# Patient Record
Sex: Female | Born: 1980 | Race: Black or African American | Hispanic: No | Marital: Single | Smoking: Current some day smoker
Health system: Southern US, Community
[De-identification: ages and names within clinical notes are randomized; demographics above are authoritative.]

## PROBLEM LIST (undated history)

## (undated) ENCOUNTER — Inpatient Hospital Stay (HOSPITAL_COMMUNITY): Payer: Self-pay

## (undated) DIAGNOSIS — N6459 Other signs and symptoms in breast: Secondary | ICD-10-CM

## (undated) DIAGNOSIS — D649 Anemia, unspecified: Secondary | ICD-10-CM

## (undated) DIAGNOSIS — N939 Abnormal uterine and vaginal bleeding, unspecified: Secondary | ICD-10-CM

## (undated) DIAGNOSIS — O0930 Supervision of pregnancy with insufficient antenatal care, unspecified trimester: Secondary | ICD-10-CM

## (undated) DIAGNOSIS — Z789 Other specified health status: Secondary | ICD-10-CM

## (undated) DIAGNOSIS — Z862 Personal history of diseases of the blood and blood-forming organs and certain disorders involving the immune mechanism: Secondary | ICD-10-CM

## (undated) DIAGNOSIS — L309 Dermatitis, unspecified: Secondary | ICD-10-CM

## (undated) DIAGNOSIS — Z973 Presence of spectacles and contact lenses: Secondary | ICD-10-CM

## (undated) DIAGNOSIS — Z3490 Encounter for supervision of normal pregnancy, unspecified, unspecified trimester: Secondary | ICD-10-CM

## (undated) DIAGNOSIS — Z87410 Personal history of cervical dysplasia: Secondary | ICD-10-CM

## (undated) DIAGNOSIS — Z87898 Personal history of other specified conditions: Secondary | ICD-10-CM

## (undated) HISTORY — DX: Dermatitis, unspecified: L30.9

## (undated) HISTORY — DX: Personal history of other specified conditions: Z87.898

## (undated) HISTORY — DX: Anemia, unspecified: D64.9

## (undated) HISTORY — PX: NO PAST SURGERIES: SHX2092

## (undated) SURGERY — Surgical Case
Anesthesia: *Unknown

---

## 2008-02-15 ENCOUNTER — Inpatient Hospital Stay (HOSPITAL_COMMUNITY): Admission: AD | Admit: 2008-02-15 | Discharge: 2008-02-15 | Payer: Self-pay | Admitting: Obstetrics & Gynecology

## 2008-02-15 ENCOUNTER — Ambulatory Visit: Payer: Self-pay | Admitting: Obstetrics and Gynecology

## 2008-03-01 ENCOUNTER — Inpatient Hospital Stay (HOSPITAL_COMMUNITY): Admission: RE | Admit: 2008-03-01 | Discharge: 2008-03-01 | Payer: Self-pay | Admitting: Obstetrics & Gynecology

## 2008-03-05 ENCOUNTER — Inpatient Hospital Stay (HOSPITAL_COMMUNITY): Admission: AD | Admit: 2008-03-05 | Discharge: 2008-03-05 | Payer: Self-pay | Admitting: Obstetrics & Gynecology

## 2008-03-05 ENCOUNTER — Ambulatory Visit: Payer: Self-pay | Admitting: Obstetrics & Gynecology

## 2008-03-28 ENCOUNTER — Emergency Department (HOSPITAL_COMMUNITY): Admission: EM | Admit: 2008-03-28 | Discharge: 2008-03-28 | Payer: Self-pay | Admitting: Family Medicine

## 2008-03-29 ENCOUNTER — Inpatient Hospital Stay (HOSPITAL_COMMUNITY): Admission: AD | Admit: 2008-03-29 | Discharge: 2008-03-29 | Payer: Self-pay | Admitting: Obstetrics & Gynecology

## 2009-12-28 IMAGING — US US OB LIMITED
1 series · 14 of 19 positions shown · non-contrast
Comparison: none

OBSTETRICAL ULTRASOUND:
 This ultrasound exam was performed in the [HOSPITAL] Ultrasound Department.  The OB US report was generated in the AS system, and faxed to the ordering physician.  This report is also available in [REDACTED] PACS.

[Series 1: us ob limited · 0.28mm/px · 14 of 19 slices shown]
[im 1/19]
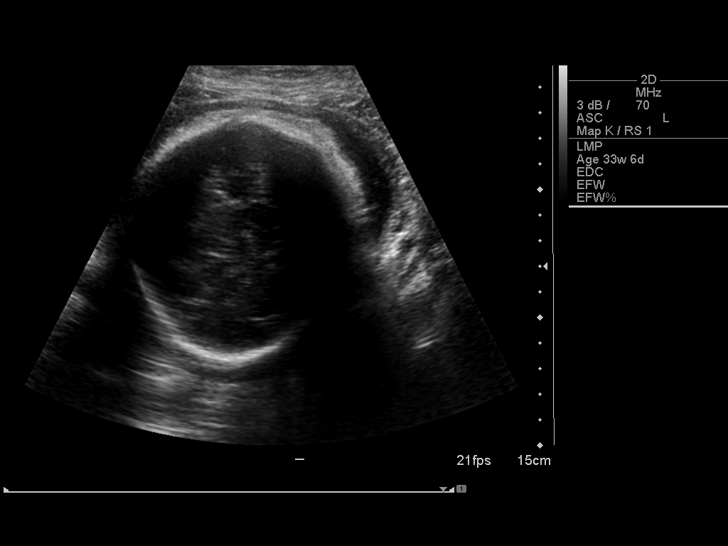
[im 3/19]
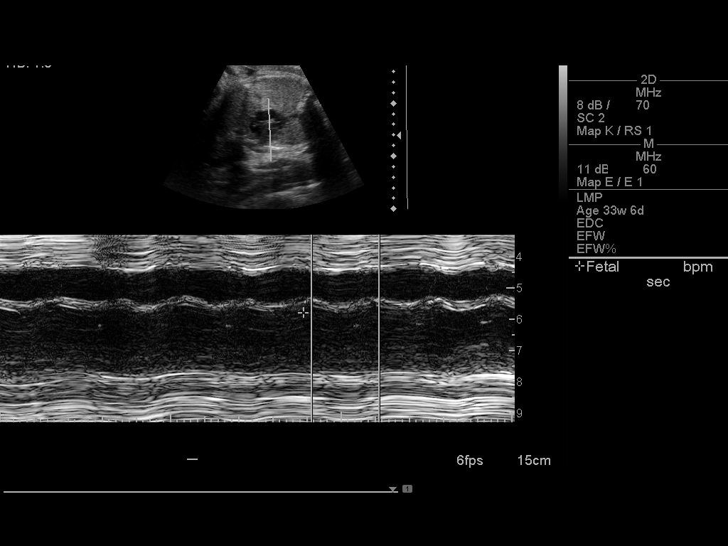
[im 4/19]
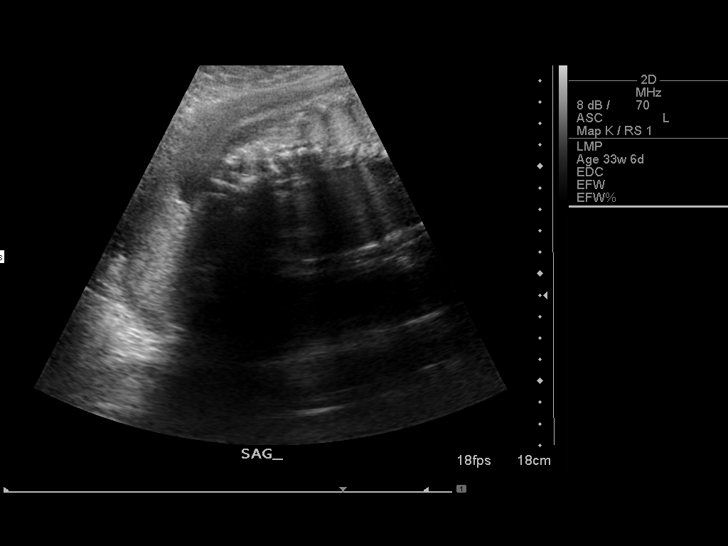
[im 5/19]
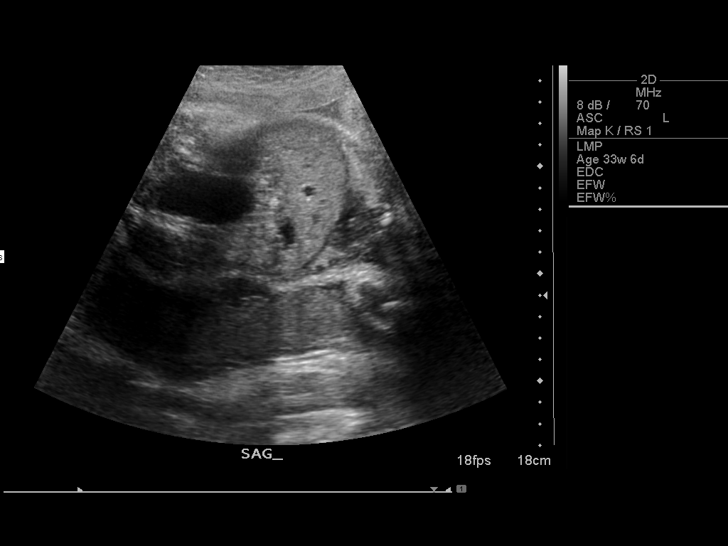
[im 7/19]
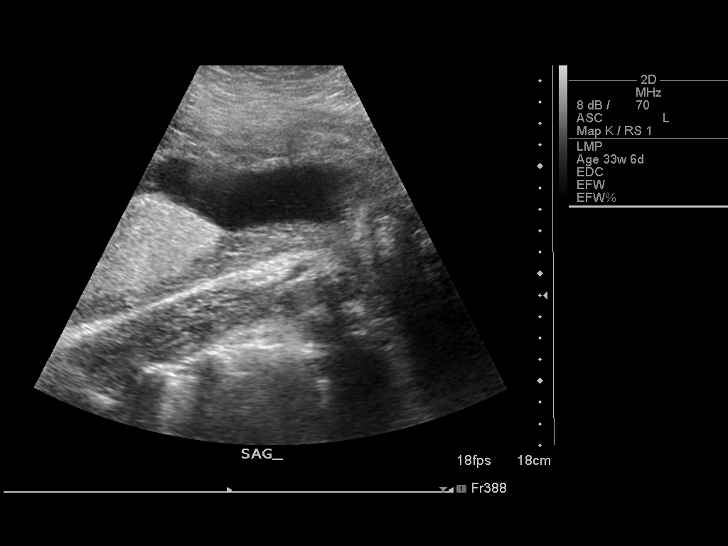
[im 8/19]
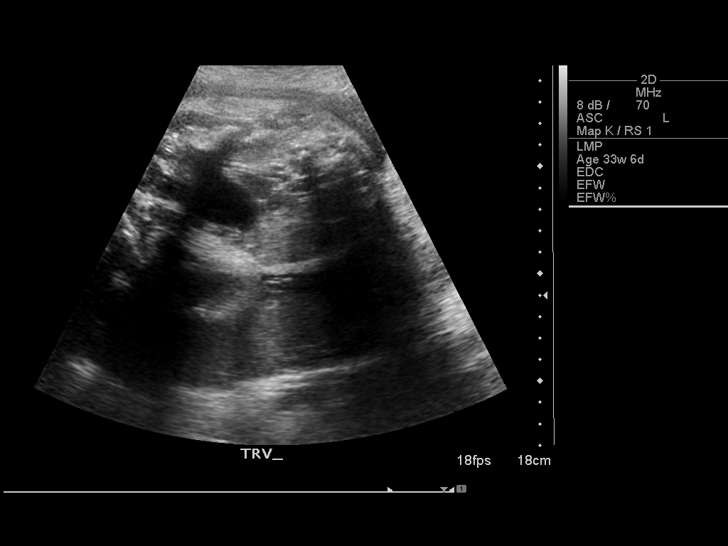
[im 9/19]
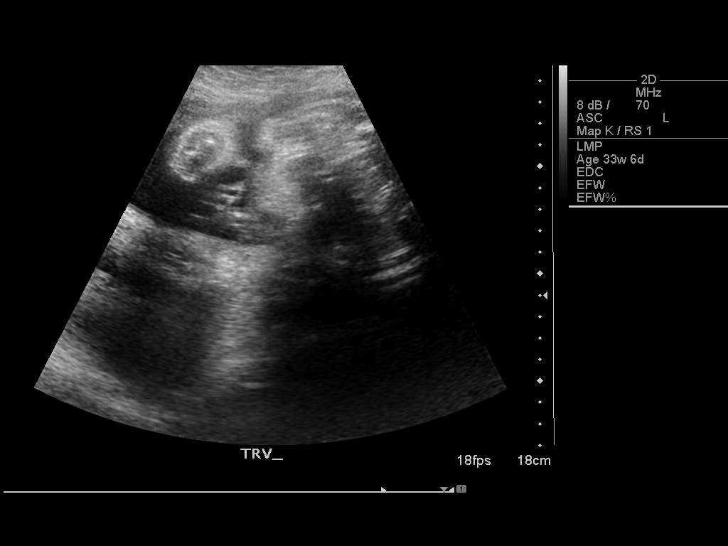
[im 11/19]
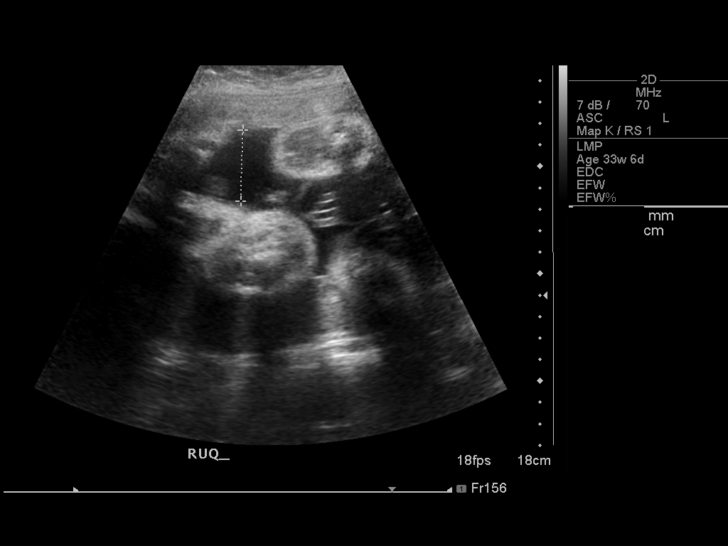
[im 12/19]
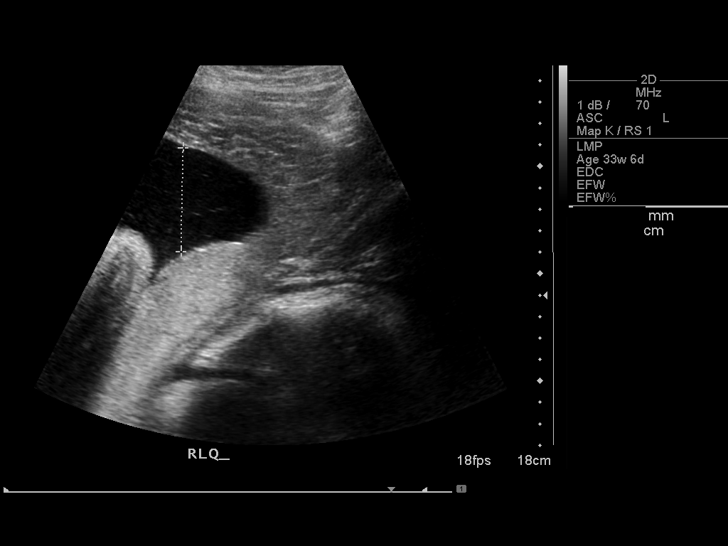
[im 13/19]
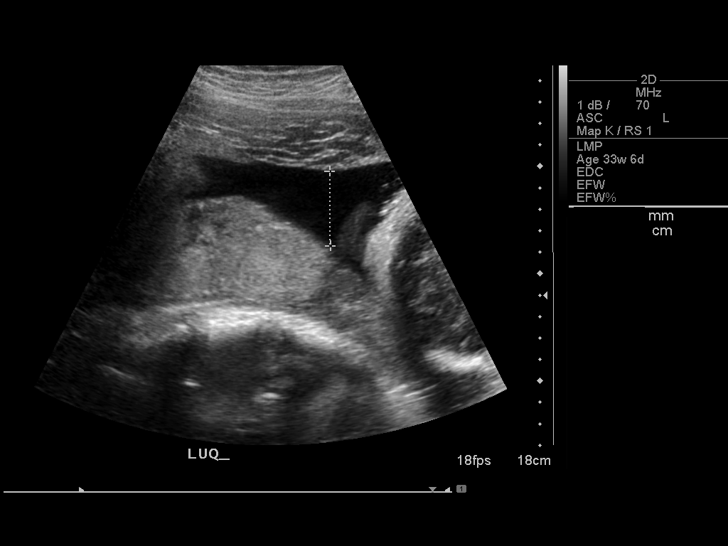
[im 15/19]
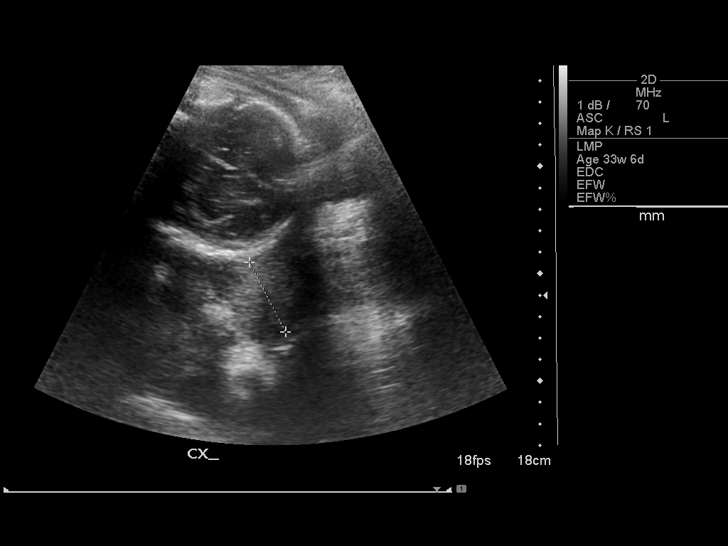
[im 16/19]
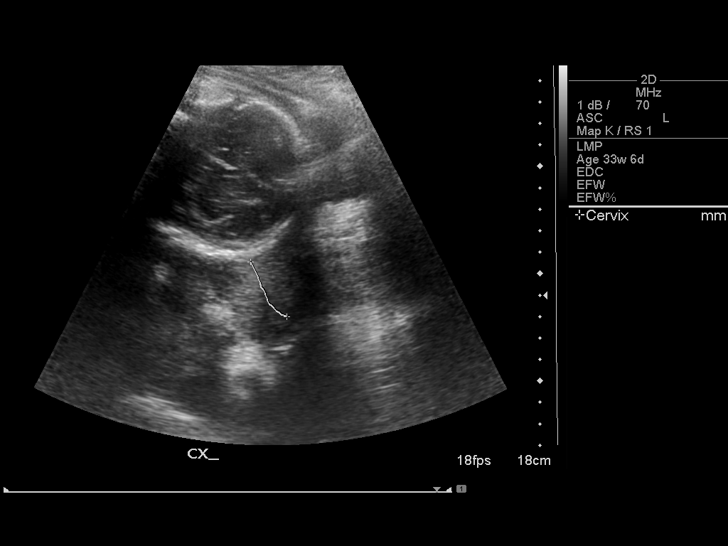
[im 17/19]
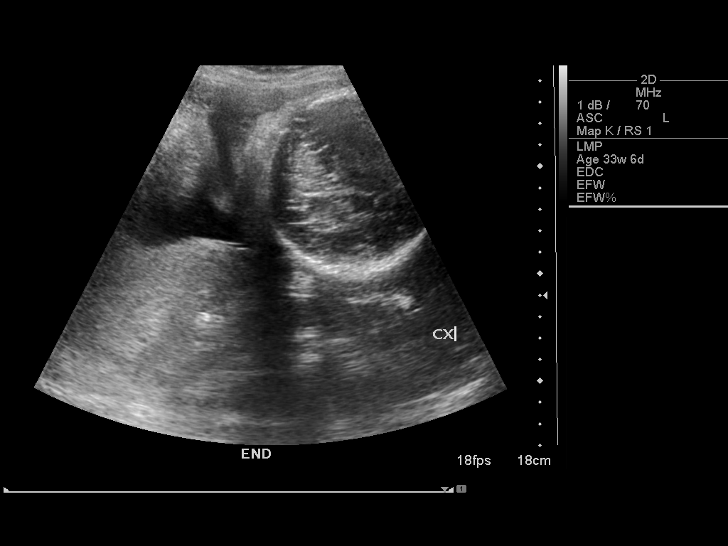
[im 19/19]
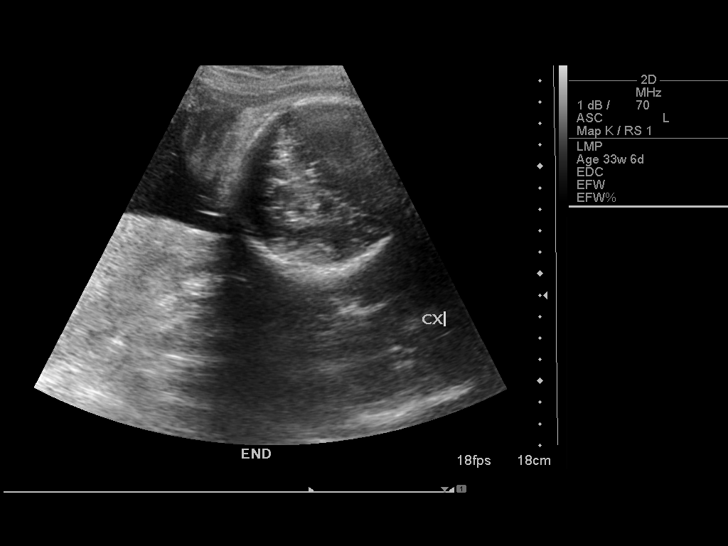

[14 of 19 positions shown; findings below may reference images not displayed]

IMPRESSION: See AS Obstetric US report.

## 2010-12-22 ENCOUNTER — Emergency Department (HOSPITAL_COMMUNITY)
Admission: EM | Admit: 2010-12-22 | Discharge: 2010-12-23 | Discharge status: Against Medical Advice | Payer: Self-pay | Attending: Emergency Medicine | Admitting: Emergency Medicine

## 2010-12-22 DIAGNOSIS — R220 Localized swelling, mass and lump, head: Secondary | ICD-10-CM | POA: Insufficient documentation

## 2010-12-22 DIAGNOSIS — K089 Disorder of teeth and supporting structures, unspecified: Secondary | ICD-10-CM | POA: Insufficient documentation

## 2011-02-21 ENCOUNTER — Emergency Department (HOSPITAL_COMMUNITY)
Admission: EM | Admit: 2011-02-21 | Discharge: 2011-02-21 | Disposition: A | Discharge status: Home / Self Care | Payer: Medicaid Other | Attending: Emergency Medicine | Admitting: Emergency Medicine

## 2011-02-21 ENCOUNTER — Emergency Department (HOSPITAL_COMMUNITY): Payer: Medicaid Other

## 2011-02-21 DIAGNOSIS — R0602 Shortness of breath: Secondary | ICD-10-CM | POA: Insufficient documentation

## 2011-02-21 DIAGNOSIS — R062 Wheezing: Secondary | ICD-10-CM | POA: Insufficient documentation

## 2011-02-21 DIAGNOSIS — R0609 Other forms of dyspnea: Secondary | ICD-10-CM | POA: Insufficient documentation

## 2011-02-21 DIAGNOSIS — R0989 Other specified symptoms and signs involving the circulatory and respiratory systems: Secondary | ICD-10-CM | POA: Insufficient documentation

## 2011-06-17 LAB — URINALYSIS, ROUTINE W REFLEX MICROSCOPIC
Glucose, UA: NEGATIVE
Hgb urine dipstick: NEGATIVE
Specific Gravity, Urine: 1.02

## 2011-06-17 LAB — URINE MICROSCOPIC-ADD ON

## 2012-05-20 LAB — OB RESULTS CONSOLE ANTIBODY SCREEN: Antibody Screen: NEGATIVE

## 2012-05-20 LAB — OB RESULTS CONSOLE GC/CHLAMYDIA
Chlamydia: NEGATIVE
Gonorrhea: NEGATIVE

## 2012-05-20 LAB — OB RESULTS CONSOLE RUBELLA ANTIBODY, IGM: Rubella: IMMUNE

## 2012-05-20 LAB — OB RESULTS CONSOLE HIV ANTIBODY (ROUTINE TESTING): HIV: NONREACTIVE

## 2012-05-20 LAB — OB RESULTS CONSOLE RPR: RPR: NONREACTIVE

## 2012-09-21 NOTE — L&D Delivery Note (Signed)
Delivery Note At 4:03 PM a viable female was delivered via  (Presentation: OA ).  APGAR: , ; weight P .   Placenta status: delivered, intact.  Cord:  with the following complications: nuchal cord x 2 .    Anesthesia: Epidural  Episiotomy: none Lacerations: none Suture Repair: N/A Est. Blood Loss (mL): 400cc  Mom to postpartum.  Baby to stay with mommy.  [name],Lisa Faux 09/27/2012, 4:14 PM  O+/ Bo/ BTL after 30day paper Desires circ for female infant, d/w pt r/b/a - to schedule

## 2012-09-23 ENCOUNTER — Inpatient Hospital Stay (HOSPITAL_COMMUNITY)
Admission: AD | Admit: 2012-09-23 | Discharge: 2012-09-23 | Disposition: A | Discharge status: Home / Self Care | Payer: Medicaid Other | Source: Ambulatory Visit | Attending: Obstetrics and Gynecology | Admitting: Obstetrics and Gynecology

## 2012-09-23 ENCOUNTER — Encounter (HOSPITAL_COMMUNITY): Payer: Self-pay | Admitting: *Deleted

## 2012-09-23 ENCOUNTER — Telehealth (HOSPITAL_COMMUNITY): Payer: Self-pay | Admitting: *Deleted

## 2012-09-23 DIAGNOSIS — O479 False labor, unspecified: Secondary | ICD-10-CM | POA: Insufficient documentation

## 2012-09-23 HISTORY — DX: Other specified health status: Z78.9

## 2012-09-23 NOTE — Telephone Encounter (Signed)
Preadmission screen  

## 2012-09-23 NOTE — MAU Note (Signed)
Pt G5 P4 at 39.1wks having contractions, denies leaking or bleeding.

## 2012-09-26 ENCOUNTER — Encounter (HOSPITAL_COMMUNITY): Payer: Self-pay

## 2012-09-26 DIAGNOSIS — Z3490 Encounter for supervision of normal pregnancy, unspecified, unspecified trimester: Secondary | ICD-10-CM

## 2012-09-26 DIAGNOSIS — O0930 Supervision of pregnancy with insufficient antenatal care, unspecified trimester: Secondary | ICD-10-CM | POA: Insufficient documentation

## 2012-09-26 HISTORY — DX: Encounter for supervision of normal pregnancy, unspecified, unspecified trimester: Z34.90

## 2012-09-26 HISTORY — DX: Supervision of pregnancy with insufficient antenatal care, unspecified trimester: O09.30

## 2012-09-26 MED ORDER — PRENATAL 27-0.8 MG PO TABS: 1.0000 | ORAL_TABLET | Freq: Every day | ORAL | Status: DC

## 2012-09-26 NOTE — H&P (Signed)
Lisa [name] is a 32 y.o. female G5P4004 at 39+ for iol given term status and favorable cervix. Pt's pregnancy complicated by late prenatal care also somewhat limited.  GBBS + in urine also.  Desires BTL, but 30 day papers signed in mid-December, so will have to be interval. D/W pt r/b/a of IOL, wishes to proceed. Maternal Medical History:  Contractions: Frequency: irregular.    Fetal activity: Perceived fetal activity is normal.    Prenatal complications: Substance abuse.     OB History    Grav Para Term Preterm Abortions TAB SAB Ect Mult Living   5 4 4       4     G1 FAVD, G2-G4 SVD, G5 present.  No Abn pap, no STDs  Past Medical History  Diagnosis Date  . No pertinent past medical history   . Anemia   . Eczema   . Hx of mastitis   . Normal pregnancy 09/26/2012  . Late prenatal care complicating pregnancy 09/26/2012   Past Surgical History  Procedure Date  . No past surgeries    Family History: family history includes COPD in her maternal aunt and maternal uncle; Cancer in her father, maternal aunt, mother, and paternal grandmother; Diabetes in her father and mother; Hypertension in her maternal grandmother and mother; and Thyroid cancer in her mother. Social History:  reports that she has been smoking Cigarettes.  She has been smoking about .25 packs per day. She has never used smokeless tobacco. She reports that she does not drink alcohol or use illicit drugs.single Meds PNV All NKDA   Prenatal Transfer Tool  Maternal Diabetes: No Genetic Screening: Normal Maternal Ultrasounds/Referrals: Normal Fetal Ultrasounds or other Referrals:  None Maternal Substance Abuse:  Yes:  Type: Smoker Significant Maternal Medications:  None Significant Maternal Lab Results:  Lab values include: Group B Strep positive Other Comments:  late PNC, somewhat limited  Review of Systems  Constitutional: Negative.   HENT: Negative.   Eyes: Negative.   Respiratory: Negative.   Cardiovascular:  Negative.   Gastrointestinal: Negative.   Genitourinary: Negative.   Musculoskeletal: Negative.   Skin: Negative.   Neurological: Negative.   Psychiatric/Behavioral: Negative.       There were no vitals taken for this visit. Maternal Exam:  Abdomen: Fundal height is appropriate for gestation.   Estimated fetal weight is 6-7#.   Fetal presentation: vertex  Introitus: Normal vulva. Normal vagina.  Pelvis: adequate for delivery.   Cervix: Cervix evaluated by digital exam.     Physical Exam  Constitutional: She is oriented to person, place, and time. She appears well-developed and well-nourished.  Cardiovascular: Normal rate and regular rhythm.   Respiratory: Effort normal and breath sounds normal. No respiratory distress.  GI: Soft. Bowel sounds are normal. There is no tenderness.  Musculoskeletal: Normal range of motion.  Neurological: She is alert and oriented to person, place, and time.  Psychiatric: She has a normal mood and affect. Her behavior is normal.    Prenatal labs: ABO, Rh: O/Positive/-- (08/30 0000) Antibody: Negative (08/30 0000) Rubella: Immune (08/30 0000) RPR: Nonreactive (08/30 0000)  HBsAg: Negative (08/30 0000)  HIV: Non-reactive (08/30 0000)  GBS: Positive (08/30 0000)  Hgg 12.0/ Pap WNL/ Plt 336K/Hgb electro WNL/ GC neg/ Chl neg/CF neg/ AFP WNL/ glucola 74  [place] cwd EDC 09/29/12, nl anat, post plac, female  Tdap/Flu 07/02/11 Assessment/Plan: 31yo [id number] at 39+ for IOL given term and favorable Expect SVD Epidural prn, IV pain meds prn Pitocin AROM after PCN  PCN for gbbs+   BOVARD,Kennetha Pearman 09/26/2012, 8:25 PM

## 2012-09-27 ENCOUNTER — Encounter (HOSPITAL_COMMUNITY): Payer: Self-pay | Admitting: Anesthesiology

## 2012-09-27 ENCOUNTER — Encounter (HOSPITAL_COMMUNITY): Payer: Self-pay

## 2012-09-27 ENCOUNTER — Inpatient Hospital Stay (HOSPITAL_COMMUNITY)
Admission: RE | Admit: 2012-09-27 | Discharge: 2012-09-29 | DRG: 775 | Disposition: A | Discharge status: Home / Self Care | Payer: Medicaid Other | Source: Ambulatory Visit | Attending: Obstetrics and Gynecology | Admitting: Obstetrics and Gynecology

## 2012-09-27 ENCOUNTER — Inpatient Hospital Stay (HOSPITAL_COMMUNITY): Payer: Medicaid Other | Admitting: Anesthesiology

## 2012-09-27 VITALS — BP 125/74 | HR 76 | Temp 98.4°F | Resp 18 | Ht 61.0 in | Wt 215.0 lb

## 2012-09-27 DIAGNOSIS — O0930 Supervision of pregnancy with insufficient antenatal care, unspecified trimester: Secondary | ICD-10-CM

## 2012-09-27 DIAGNOSIS — Z3490 Encounter for supervision of normal pregnancy, unspecified, unspecified trimester: Secondary | ICD-10-CM

## 2012-09-27 DIAGNOSIS — Z22330 Carrier of Group B streptococcus: Secondary | ICD-10-CM

## 2012-09-27 DIAGNOSIS — O99892 Other specified diseases and conditions complicating childbirth: Secondary | ICD-10-CM | POA: Diagnosis present

## 2012-09-27 HISTORY — DX: Encounter for supervision of normal pregnancy, unspecified, unspecified trimester: Z34.90

## 2012-09-27 HISTORY — DX: Supervision of pregnancy with insufficient antenatal care, unspecified trimester: O09.30

## 2012-09-27 LAB — CBC
HCT: 34.6 % — ABNORMAL LOW (ref 36.0–46.0)
MCV: 91.5 fL (ref 78.0–100.0)
RDW: 13.8 % (ref 11.5–15.5)
WBC: 11.2 10*3/uL — ABNORMAL HIGH (ref 4.0–10.5)

## 2012-09-27 LAB — TYPE AND SCREEN
ABO/RH(D): O POS
Antibody Screen: NEGATIVE

## 2012-09-27 LAB — ABO/RH: ABO/RH(D): O POS

## 2012-09-27 MED ORDER — SENNOSIDES-DOCUSATE SODIUM 8.6-50 MG PO TABS
2.0000 | ORAL_TABLET | Freq: Every day | ORAL | Status: DC
  Administered 2012-09-27 – 2012-09-28 (×2): 2 via ORAL

## 2012-09-27 MED ORDER — IBUPROFEN 600 MG PO TABS
600.0000 mg | ORAL_TABLET | Freq: Four times a day (QID) | ORAL | Status: DC
  Administered 2012-09-28 – 2012-09-29 (×7): 600 mg via ORAL
  Filled 2012-09-27 (×7): qty 1

## 2012-09-27 MED ORDER — ONDANSETRON HCL 4 MG/2ML IJ SOLN
4.0000 mg | Freq: Four times a day (QID) | INTRAMUSCULAR | Status: DC | PRN
  Administered 2012-09-27: 4 mg via INTRAVENOUS
  Filled 2012-09-27: qty 2

## 2012-09-27 MED ORDER — ACETAMINOPHEN 325 MG PO TABS: 650.0000 mg | ORAL_TABLET | ORAL | Status: DC | PRN

## 2012-09-27 MED ORDER — ONDANSETRON HCL 4 MG/2ML IJ SOLN: 4.0000 mg | INTRAMUSCULAR | Status: DC | PRN

## 2012-09-27 MED ORDER — PENICILLIN G POTASSIUM 5000000 UNITS IJ SOLR
5.0000 10*6.[IU] | Freq: Once | INTRAVENOUS | Status: AC
  Administered 2012-09-27: 5 10*6.[IU] via INTRAVENOUS
  Filled 2012-09-27: qty 5

## 2012-09-27 MED ORDER — PHENYLEPHRINE 40 MCG/ML (10ML) SYRINGE FOR IV PUSH (FOR BLOOD PRESSURE SUPPORT)
80.0000 ug | PREFILLED_SYRINGE | INTRAVENOUS | Status: DC | PRN
  Administered 2012-09-27: 80 ug via INTRAVENOUS

## 2012-09-27 MED ORDER — LACTATED RINGERS IV SOLN: 500.0000 mL | Freq: Once | INTRAVENOUS | Status: DC

## 2012-09-27 MED ORDER — DIPHENHYDRAMINE HCL 25 MG PO CAPS: 25.0000 mg | ORAL_CAPSULE | Freq: Four times a day (QID) | ORAL | Status: DC | PRN

## 2012-09-27 MED ORDER — LIDOCAINE HCL (PF) 1 % IJ SOLN
INTRAMUSCULAR | Status: DC | PRN
  Administered 2012-09-27 (×2): 4 mL

## 2012-09-27 MED ORDER — WITCH HAZEL-GLYCERIN EX PADS: 1.0000 "application " | MEDICATED_PAD | CUTANEOUS | Status: DC | PRN

## 2012-09-27 MED ORDER — TETANUS-DIPHTH-ACELL PERTUSSIS 5-2.5-18.5 LF-MCG/0.5 IM SUSP: 0.5000 mL | Freq: Once | INTRAMUSCULAR | Status: DC

## 2012-09-27 MED ORDER — LACTATED RINGERS IV SOLN: INTRAVENOUS | Status: DC

## 2012-09-27 MED ORDER — SIMETHICONE 80 MG PO CHEW: 80.0000 mg | CHEWABLE_TABLET | ORAL | Status: DC | PRN

## 2012-09-27 MED ORDER — DIPHENHYDRAMINE HCL 50 MG/ML IJ SOLN: 12.5000 mg | INTRAMUSCULAR | Status: DC | PRN

## 2012-09-27 MED ORDER — ONDANSETRON HCL 4 MG PO TABS: 4.0000 mg | ORAL_TABLET | ORAL | Status: DC | PRN

## 2012-09-27 MED ORDER — PRENATAL MULTIVITAMIN CH
1.0000 | ORAL_TABLET | Freq: Every day | ORAL | Status: DC
  Administered 2012-09-27 – 2012-09-29 (×3): 1 via ORAL
  Filled 2012-09-27 (×3): qty 1

## 2012-09-27 MED ORDER — OXYTOCIN 40 UNITS IN LACTATED RINGERS INFUSION - SIMPLE MED: 62.5000 mL/h | INTRAVENOUS | Status: DC

## 2012-09-27 MED ORDER — TERBUTALINE SULFATE 1 MG/ML IJ SOLN: 0.2500 mg | Freq: Once | INTRAMUSCULAR | Status: DC | PRN

## 2012-09-27 MED ORDER — LACTATED RINGERS IV SOLN
INTRAVENOUS | Status: DC
  Administered 2012-09-27: 900 mL via INTRAVENOUS
  Administered 2012-09-27: 1000 mL via INTRAVENOUS

## 2012-09-27 MED ORDER — OXYTOCIN 40 UNITS IN LACTATED RINGERS INFUSION - SIMPLE MED
1.0000 m[IU]/min | INTRAVENOUS | Status: DC
  Administered 2012-09-27: 2 m[IU]/min via INTRAVENOUS
  Administered 2012-09-27: 10 m[IU]/min via INTRAVENOUS
  Filled 2012-09-27: qty 1000

## 2012-09-27 MED ORDER — OXYTOCIN BOLUS FROM INFUSION: 500.0000 mL | INTRAVENOUS | Status: DC

## 2012-09-27 MED ORDER — FENTANYL 2.5 MCG/ML BUPIVACAINE 1/10 % EPIDURAL INFUSION (WH - ANES)
INTRAMUSCULAR | Status: DC | PRN
  Administered 2012-09-27: 12 mL/h via EPIDURAL

## 2012-09-27 MED ORDER — OXYCODONE-ACETAMINOPHEN 5-325 MG PO TABS: 1.0000 | ORAL_TABLET | ORAL | Status: DC | PRN

## 2012-09-27 MED ORDER — PHENYLEPHRINE 40 MCG/ML (10ML) SYRINGE FOR IV PUSH (FOR BLOOD PRESSURE SUPPORT)
80.0000 ug | PREFILLED_SYRINGE | INTRAVENOUS | Status: DC | PRN
  Filled 2012-09-27: qty 5

## 2012-09-27 MED ORDER — PENICILLIN G POTASSIUM 5000000 UNITS IJ SOLR
2.5000 10*6.[IU] | INTRAVENOUS | Status: DC
  Administered 2012-09-27 (×2): 2.5 10*6.[IU] via INTRAVENOUS
  Filled 2012-09-27 (×5): qty 2.5

## 2012-09-27 MED ORDER — BUTORPHANOL TARTRATE 1 MG/ML IJ SOLN: 2.0000 mg | INTRAMUSCULAR | Status: DC | PRN

## 2012-09-27 MED ORDER — IBUPROFEN 600 MG PO TABS: 600.0000 mg | ORAL_TABLET | Freq: Four times a day (QID) | ORAL | Status: DC | PRN

## 2012-09-27 MED ORDER — EPHEDRINE 5 MG/ML INJ: 10.0000 mg | INTRAVENOUS | Status: DC | PRN

## 2012-09-27 MED ORDER — EPHEDRINE 5 MG/ML INJ
10.0000 mg | INTRAVENOUS | Status: DC | PRN
  Filled 2012-09-27: qty 4

## 2012-09-27 MED ORDER — BENZOCAINE-MENTHOL 20-0.5 % EX AERO: 1.0000 "application " | INHALATION_SPRAY | CUTANEOUS | Status: DC | PRN

## 2012-09-27 MED ORDER — LACTATED RINGERS IV SOLN
500.0000 mL | INTRAVENOUS | Status: DC | PRN
  Administered 2012-09-27: 1000 mL via INTRAVENOUS

## 2012-09-27 MED ORDER — ZOLPIDEM TARTRATE 5 MG PO TABS: 5.0000 mg | ORAL_TABLET | Freq: Every evening | ORAL | Status: DC | PRN

## 2012-09-27 MED ORDER — CITRIC ACID-SODIUM CITRATE 334-500 MG/5ML PO SOLN: 30.0000 mL | ORAL | Status: DC | PRN

## 2012-09-27 MED ORDER — FENTANYL 2.5 MCG/ML BUPIVACAINE 1/10 % EPIDURAL INFUSION (WH - ANES)
14.0000 mL/h | INTRAMUSCULAR | Status: DC
  Filled 2012-09-27: qty 125

## 2012-09-27 MED ORDER — LIDOCAINE HCL (PF) 1 % IJ SOLN: 30.0000 mL | INTRAMUSCULAR | Status: DC | PRN

## 2012-09-27 MED ORDER — DIBUCAINE 1 % RE OINT: 1.0000 "application " | TOPICAL_OINTMENT | RECTAL | Status: DC | PRN

## 2012-09-27 MED ORDER — OXYCODONE-ACETAMINOPHEN 5-325 MG PO TABS
1.0000 | ORAL_TABLET | ORAL | Status: DC | PRN
  Administered 2012-09-28 (×2): 1 via ORAL
  Filled 2012-09-27 (×2): qty 1

## 2012-09-27 MED ORDER — LANOLIN HYDROUS EX OINT: TOPICAL_OINTMENT | CUTANEOUS | Status: DC | PRN

## 2012-09-27 NOTE — Anesthesia Procedure Notes (Signed)
Epidural Patient location during procedure: OB Start time: 09/27/2012 10:32 AM  Staffing Anesthesiologist: Edgard Debord A. Performed by: anesthesiologist   Preanesthetic Checklist Completed: patient identified, site marked, surgical consent, pre-op evaluation, timeout performed, IV checked, risks and benefits discussed and monitors and equipment checked  Epidural Patient position: sitting Prep: site prepped and draped and DuraPrep Patient monitoring: continuous pulse ox and blood pressure Approach: midline Injection technique: LOR air  Needle:  Needle type: Tuohy  Needle gauge: 17 G Needle length: 9 cm and 9 Needle insertion depth: 5 cm cm Catheter type: closed end flexible Catheter size: 19 Gauge Catheter at skin depth: 10 cm Test dose: negative and Other  Assessment Events: blood not aspirated, injection not painful, no injection resistance, negative IV test and no paresthesia  Additional Notes Patient identified. Risks and benefits discussed including failed block, incomplete  Pain control, post dural puncture headache, nerve damage, paralysis, blood pressure Changes, nausea, vomiting, reactions to medications-both toxic and allergic and post Partum back pain. All questions were answered. Patient expressed understanding and wished to proceed. Sterile technique was used throughout procedure. Epidural site was Dressed with sterile barrier dressing. No paresthesias, signs of intravascular injection Or signs of intrathecal spread were encountered.  Patient was more comfortable after the epidural was dosed. Please see RN's note for documentation of vital signs and FHR which are stable.

## 2012-09-27 NOTE — Anesthesia Preprocedure Evaluation (Signed)
Anesthesia Evaluation  Patient identified by MRN, date of birth, ID band Patient awake    Reviewed: Allergy & Precautions, H&P , Patient's Chart, lab work & pertinent test results  Airway Mallampati: III TM Distance: >3 FB Neck ROM: full    Dental No notable dental hx. (+) Teeth Intact   Pulmonary neg pulmonary ROS, Current Smoker,  breath sounds clear to auscultation  Pulmonary exam normal       Cardiovascular negative cardio ROS  Rhythm:regular Rate:Normal     Neuro/Psych negative neurological ROS  negative psych ROS   GI/Hepatic negative GI ROS, Neg liver ROS,   Endo/Other  negative endocrine ROS  Renal/GU negative Renal ROS  negative genitourinary   Musculoskeletal   Abdominal Normal abdominal exam  (+)   Peds  Hematology  (+) anemia ,   Anesthesia Other Findings   Reproductive/Obstetrics (+) Pregnancy                           Anesthesia Physical Anesthesia Plan  ASA: II  Anesthesia Plan: Epidural   Post-op Pain Management:    Induction:   Airway Management Planned:   Additional Equipment:   Intra-op Plan:   Post-operative Plan:   Informed Consent: I have reviewed the patients History and Physical, chart, labs and discussed the procedure including the risks, benefits and alternatives for the proposed anesthesia with the patient or authorized representative who has indicated his/her understanding and acceptance.     Plan Discussed with: Anesthesiologist  Anesthesia Plan Comments:         Anesthesia Quick Evaluation

## 2012-09-27 NOTE — Progress Notes (Signed)
Patient ID: Lisa [name], female   DOB: 1981/04/15, 32 y.o.   MRN: [id number]  Comfortable with epidural  AFVSS  gen NAD FHTs 140's mod var toco q 4 min  AROM for clear fluid, w/o diff/comp  32 yo G5P4004 for iol contimnue iol, expect SVD

## 2012-09-27 NOTE — Progress Notes (Signed)
Patient ID: Lisa [name], female   DOB: 08/29/1981, 32 y.o.   MRN: [id number]  Reviewed POC SVE 4/50/-2 130, category 1 toco irr  pitocin for IOL, AROM 4 hr after pitocin

## 2012-09-28 LAB — CBC
HCT: 32.9 % — ABNORMAL LOW (ref 36.0–46.0)
Hemoglobin: 10.9 g/dL — ABNORMAL LOW (ref 12.0–15.0)
MCH: 30.4 pg (ref 26.0–34.0)
MCV: 91.9 fL (ref 78.0–100.0)
Platelets: 139 10*3/uL — ABNORMAL LOW (ref 150–400)
RBC: 3.58 MIL/uL — ABNORMAL LOW (ref 3.87–5.11)
WBC: 14 10*3/uL — ABNORMAL HIGH (ref 4.0–10.5)

## 2012-09-28 MED ORDER — PNEUMOCOCCAL VAC POLYVALENT 25 MCG/0.5ML IJ INJ
0.5000 mL | INJECTION | INTRAMUSCULAR | Status: AC
  Administered 2012-09-28: 0.5 mL via INTRAMUSCULAR
  Filled 2012-09-28: qty 0.5

## 2012-09-28 NOTE — Anesthesia Postprocedure Evaluation (Signed)
  Anesthesia Post-op Note  Patient: Lisa [name]  Procedure(s) Performed: * No procedures listed *  Patient Location: Mother/Baby  Anesthesia Type:Epidural  Level of Consciousness: awake  Airway and Oxygen Therapy: Patient Spontanous Breathing  Post-op Pain: mild  Post-op Assessment: Patient's Cardiovascular Status Stable and Respiratory Function Stable  Post-op Vital Signs: stable  Complications: No apparent anesthesia complications

## 2012-09-28 NOTE — Progress Notes (Signed)
Post Partum Day 1 Subjective: no complaints, up ad lib, tolerating PO and nl lochia, pain controlled  Objective: Blood pressure 80/51, pulse 63, temperature 98 F (36.7 C), temperature source Oral, resp. rate 18, height 5\' 1"  (1.549 m), weight 97.523 kg (215 lb), SpO2 99.00%, unknown if currently breastfeeding.  Physical Exam:  General: alert and no distress Lochia: appropriate Uterine Fundus: firm   Basename 09/28/12 0545 09/27/12 0720  HGB 10.9* 11.5*  HCT 32.9* 34.6*    Assessment/Plan: Plan for discharge tomorrow.  Doing well. Routine care.     LOS: 1 day   BOVARD,Trulee Hamstra 09/28/2012, 8:20 AM

## 2012-09-28 NOTE — Progress Notes (Signed)
UR chart review completed.  

## 2012-09-29 MED ORDER — IBUPROFEN 800 MG PO TABS: 800.0000 mg | ORAL_TABLET | Freq: Three times a day (TID) | ORAL | Status: DC | PRN

## 2012-09-29 MED ORDER — PRENATAL MULTIVITAMIN CH: 1.0000 | ORAL_TABLET | Freq: Every day | ORAL | Status: DC

## 2012-09-29 MED ORDER — OXYCODONE-ACETAMINOPHEN 5-325 MG PO TABS: 1.0000 | ORAL_TABLET | Freq: Four times a day (QID) | ORAL | Status: DC | PRN

## 2012-09-29 NOTE — Progress Notes (Signed)
Post Partum Day 2 Subjective: no complaints, up ad lib, voiding, tolerating PO and nl lochia, pain controlled.    Objective: Blood pressure 125/74, pulse 76, temperature 98.4 F (36.9 C), temperature source Oral, resp. rate 18, height 5\' 1"  (1.549 m), weight 97.523 kg (215 lb), SpO2 98.00%, unknown if currently breastfeeding.  Physical Exam:  General: alert and no distress Lochia: appropriate Uterine Fundus: firm   Basename 09/28/12 0545 09/27/12 0720  HGB 10.9* 11.5*  HCT 32.9* 34.6*    Assessment/Plan: Discharge home.  D/c with motrin/percocet/pnv, f/u 6 weks.     LOS: 2 days   BOVARD,Oluwadarasimi Favor 09/29/2012, 8:29 AM

## 2012-09-29 NOTE — Discharge Summary (Signed)
Obstetric Discharge Summary Reason for Admission: induction of labor Prenatal Procedures: none Intrapartum Procedures: spontaneous vaginal delivery Postpartum Procedures: none Complications-Operative and Postpartum: none Hemoglobin  Date Value Range Status  09/28/2012 10.9* 12.0 - 15.0 g/dL Final     HCT  Date Value Range Status  09/28/2012 32.9* 36.0 - 46.0 % Final    Physical Exam:  General: alert and no distress Lochia: appropriate Uterine Fundus: firm  Discharge Diagnoses: Term Pregnancy-delivered  Discharge Information: Date: 09/29/2012 Activity: pelvic rest Diet: routine Medications: PNV, Ibuprofen and Percocet Condition: stable Instructions: refer to practice specific booklet Discharge to: home Follow-up Information    Schedule an appointment as soon as possible for a visit with BOVARD,Gideon Burstein, MD. (4-6 weeks for Postpartum check - to schedule BTL)    Contact information:   [address] [place] [address] [phone]          Newborn Data: Live born female  Birth Weight: 6 lb 13 oz (3090 g) APGAR: 9, 9  Home with mother.  BOVARD,Senaya Dicenso 09/29/2012, 8:39 AM

## 2012-11-15 ENCOUNTER — Encounter (HOSPITAL_COMMUNITY): Payer: Self-pay | Admitting: *Deleted

## 2012-11-16 NOTE — H&P (Signed)
Lisa [name] is an 32 y.o. female. She is s/p SVD on January 7th, normal postpartum exam.  She wants permanent sterility and is admitted for surgical management of this.  Pertinent Gynecological History: Last pap: normal Date: 04-2012 OB History: G5, P5005  SVD at term x 5  Menstrual History: Patient's last menstrual period was 11/12/2012.    Past Medical History  Diagnosis Date  . No pertinent past medical history   . Eczema   . Hx of mastitis   . Normal pregnancy 09/26/2012  . Late prenatal care complicating pregnancy 09/26/2012  . SVD (spontaneous vaginal delivery) 09/27/2012  . Anemia     history with pregnancy    Past Surgical History  Procedure Laterality Date  . No past surgeries      Family History  Problem Relation Age of Onset  . Diabetes Mother   . Cancer Mother   . Hypertension Mother   . Thyroid cancer Mother   . Cancer Father     bone  . Diabetes Father   . Hypertension Maternal Grandmother   . Cancer Paternal Grandmother     pancreas  . Cancer Maternal Aunt     pancreas  . COPD Maternal Aunt   . COPD Maternal Uncle     Social History:  reports that she has been smoking Cigarettes.  She has a .5 pack-year smoking history. She has never used smokeless tobacco. She reports that she does not drink alcohol or use illicit drugs.  Allergies: No Known Allergies  No prescriptions prior to admission    Review of Systems  Respiratory: Negative.   Cardiovascular: Negative.   Gastrointestinal: Negative.   Genitourinary: Negative.     Last menstrual period 11/12/2012, not currently breastfeeding. Physical Exam  Constitutional: She appears well-developed and well-nourished.  Cardiovascular: Normal rate, regular rhythm and normal heart sounds.   No murmur heard. Respiratory: Effort normal and breath sounds normal. No respiratory distress. She has no wheezes.  GI: Soft. She exhibits no distension and no mass. There is no tenderness.  Genitourinary: Vagina  normal.  Uterus midplanar, normal size No adnexal mass or tenderness    No results found for this or any previous visit (from the past 24 hour(s)).  No results found.  Assessment/Plan: Desires surgical sterility.  The procedure, risks, permanency, failure rate, other options have all been discussed.  Will admit for laparoscopic bilateral distal salpingectomy.  [name] 11/16/2012, 11:22 AM

## 2012-11-17 ENCOUNTER — Encounter (HOSPITAL_COMMUNITY): Payer: Self-pay | Admitting: Registered Nurse

## 2012-11-17 ENCOUNTER — Ambulatory Visit (HOSPITAL_COMMUNITY)
Admission: RE | Admit: 2012-11-17 | Discharge: 2012-11-17 | Disposition: A | Discharge status: Home / Self Care | Payer: Medicaid Other | Source: Ambulatory Visit | Attending: Obstetrics and Gynecology | Admitting: Obstetrics and Gynecology

## 2012-11-17 ENCOUNTER — Ambulatory Visit (HOSPITAL_COMMUNITY): Payer: Medicaid Other | Admitting: Registered Nurse

## 2012-11-17 ENCOUNTER — Encounter (HOSPITAL_COMMUNITY)
Admission: RE | Disposition: A | Discharge status: Home / Self Care | Payer: Self-pay | Source: Ambulatory Visit | Attending: Obstetrics and Gynecology

## 2012-11-17 DIAGNOSIS — Z01812 Encounter for preprocedural laboratory examination: Secondary | ICD-10-CM | POA: Insufficient documentation

## 2012-11-17 DIAGNOSIS — Z302 Encounter for sterilization: Secondary | ICD-10-CM | POA: Insufficient documentation

## 2012-11-17 DIAGNOSIS — Z309 Encounter for contraceptive management, unspecified: Secondary | ICD-10-CM

## 2012-11-17 HISTORY — PX: LAPAROSCOPY: SHX197

## 2012-11-17 HISTORY — PX: BILATERAL SALPINGECTOMY: SHX5743

## 2012-11-17 LAB — SURGICAL PCR SCREEN: MRSA, PCR: NEGATIVE

## 2012-11-17 LAB — CBC
HCT: 37.9 % (ref 36.0–46.0)
Hemoglobin: 12.3 g/dL (ref 12.0–15.0)
MCH: 29.4 pg (ref 26.0–34.0)
MCHC: 32.5 g/dL (ref 30.0–36.0)
RBC: 4.18 MIL/uL (ref 3.87–5.11)

## 2012-11-17 LAB — PREGNANCY, URINE: Preg Test, Ur: NEGATIVE

## 2012-11-17 SURGERY — LAPAROSCOPY OPERATIVE
Anesthesia: General | Laterality: Bilateral

## 2012-11-17 MED ORDER — ROCURONIUM BROMIDE 50 MG/5ML IV SOLN
INTRAVENOUS | Status: AC
  Filled 2012-11-17: qty 1

## 2012-11-17 MED ORDER — MIDAZOLAM HCL 2 MG/2ML IJ SOLN
INTRAMUSCULAR | Status: AC
  Filled 2012-11-17: qty 2

## 2012-11-17 MED ORDER — MUPIROCIN 2 % EX OINT: TOPICAL_OINTMENT | Freq: Two times a day (BID) | CUTANEOUS | Status: DC

## 2012-11-17 MED ORDER — MIDAZOLAM HCL 5 MG/5ML IJ SOLN
INTRAMUSCULAR | Status: DC | PRN
  Administered 2012-11-17: 2 mg via INTRAVENOUS

## 2012-11-17 MED ORDER — OXYCODONE-ACETAMINOPHEN 5-325 MG PO TABS
ORAL_TABLET | ORAL | Status: AC
  Filled 2012-11-17: qty 1

## 2012-11-17 MED ORDER — KETOROLAC TROMETHAMINE 30 MG/ML IJ SOLN
INTRAMUSCULAR | Status: AC
  Filled 2012-11-17: qty 1

## 2012-11-17 MED ORDER — ROCURONIUM BROMIDE 100 MG/10ML IV SOLN
INTRAVENOUS | Status: DC | PRN
  Administered 2012-11-17: 40 mg via INTRAVENOUS

## 2012-11-17 MED ORDER — GLYCOPYRROLATE 0.2 MG/ML IJ SOLN
INTRAMUSCULAR | Status: AC
  Filled 2012-11-17: qty 2

## 2012-11-17 MED ORDER — FENTANYL CITRATE 0.05 MG/ML IJ SOLN: 25.0000 ug | INTRAMUSCULAR | Status: DC | PRN

## 2012-11-17 MED ORDER — OXYCODONE-ACETAMINOPHEN 5-325 MG PO TABS: 1.0000 | ORAL_TABLET | ORAL | Status: DC | PRN

## 2012-11-17 MED ORDER — NEOSTIGMINE METHYLSULFATE 1 MG/ML IJ SOLN
INTRAMUSCULAR | Status: DC | PRN
  Administered 2012-11-17: 3 mg via INTRAVENOUS

## 2012-11-17 MED ORDER — KETOROLAC TROMETHAMINE 30 MG/ML IJ SOLN
INTRAMUSCULAR | Status: DC | PRN
  Administered 2012-11-17: 30 mg via INTRAVENOUS

## 2012-11-17 MED ORDER — ONDANSETRON HCL 4 MG/2ML IJ SOLN
INTRAMUSCULAR | Status: DC | PRN
  Administered 2012-11-17: 4 mg via INTRAVENOUS

## 2012-11-17 MED ORDER — BUPIVACAINE HCL (PF) 0.25 % IJ SOLN
INTRAMUSCULAR | Status: DC | PRN
  Administered 2012-11-17: 10 mL

## 2012-11-17 MED ORDER — MUPIROCIN 2 % EX OINT
TOPICAL_OINTMENT | CUTANEOUS | Status: AC
  Filled 2012-11-17: qty 22

## 2012-11-17 MED ORDER — ONDANSETRON HCL 4 MG/2ML IJ SOLN
INTRAMUSCULAR | Status: AC
  Filled 2012-11-17: qty 2

## 2012-11-17 MED ORDER — LACTATED RINGERS IV SOLN
INTRAVENOUS | Status: DC
  Administered 2012-11-17 (×2): via INTRAVENOUS

## 2012-11-17 MED ORDER — FENTANYL CITRATE 0.05 MG/ML IJ SOLN
INTRAMUSCULAR | Status: AC
  Filled 2012-11-17: qty 5

## 2012-11-17 MED ORDER — LIDOCAINE HCL (CARDIAC) 20 MG/ML IV SOLN
INTRAVENOUS | Status: DC | PRN
  Administered 2012-11-17: 50 mg via INTRAVENOUS

## 2012-11-17 MED ORDER — LIDOCAINE HCL (CARDIAC) 20 MG/ML IV SOLN
INTRAVENOUS | Status: AC
  Filled 2012-11-17: qty 5

## 2012-11-17 MED ORDER — SODIUM CHLORIDE 0.9 % IJ SOLN
INTRAMUSCULAR | Status: DC | PRN
  Administered 2012-11-17: 3 mL via INTRAVENOUS

## 2012-11-17 MED ORDER — FENTANYL CITRATE 0.05 MG/ML IJ SOLN
INTRAMUSCULAR | Status: DC | PRN
  Administered 2012-11-17: 50 ug via INTRAVENOUS
  Administered 2012-11-17: 100 ug via INTRAVENOUS
  Administered 2012-11-17 (×2): 50 ug via INTRAVENOUS

## 2012-11-17 MED ORDER — GLYCOPYRROLATE 0.2 MG/ML IJ SOLN
INTRAMUSCULAR | Status: DC | PRN
  Administered 2012-11-17: 0.4 mg via INTRAVENOUS

## 2012-11-17 MED ORDER — BUPIVACAINE HCL (PF) 0.25 % IJ SOLN
INTRAMUSCULAR | Status: AC
  Filled 2012-11-17: qty 30

## 2012-11-17 MED ORDER — NEOSTIGMINE METHYLSULFATE 1 MG/ML IJ SOLN
INTRAMUSCULAR | Status: AC
  Filled 2012-11-17: qty 1

## 2012-11-17 MED ORDER — OXYCODONE-ACETAMINOPHEN 5-325 MG PO TABS
1.0000 | ORAL_TABLET | ORAL | Status: DC | PRN
  Administered 2012-11-17: 1 via ORAL

## 2012-11-17 MED ORDER — DEXAMETHASONE SODIUM PHOSPHATE 10 MG/ML IJ SOLN
INTRAMUSCULAR | Status: AC
  Filled 2012-11-17: qty 1

## 2012-11-17 MED ORDER — DEXAMETHASONE SODIUM PHOSPHATE 10 MG/ML IJ SOLN
INTRAMUSCULAR | Status: DC | PRN
  Administered 2012-11-17: 10 mg via INTRAVENOUS

## 2012-11-17 MED ORDER — LACTATED RINGERS IV SOLN: INTRAVENOUS | Status: DC

## 2012-11-17 MED ORDER — PROPOFOL 10 MG/ML IV BOLUS
INTRAVENOUS | Status: DC | PRN
  Administered 2012-11-17: 200 mg via INTRAVENOUS

## 2012-11-17 MED ORDER — PROPOFOL 10 MG/ML IV EMUL
INTRAVENOUS | Status: AC
  Filled 2012-11-17: qty 20

## 2012-11-17 SURGICAL SUPPLY — 30 items
CABLE HIGH FREQUENCY MONO STRZ (ELECTRODE) IMPLANT
CATH FOLEY 2WAY SLVR  5CC 14FR (CATHETERS) ×1
CATH FOLEY 2WAY SLVR 5CC 14FR (CATHETERS) ×1 IMPLANT
CATH ROBINSON RED A/P 16FR (CATHETERS) IMPLANT
CHLORAPREP W/TINT 26ML (MISCELLANEOUS) ×2 IMPLANT
CLOTH BEACON ORANGE TIMEOUT ST (SAFETY) ×2 IMPLANT
DECANTER SPIKE VIAL GLASS SM (MISCELLANEOUS) ×2 IMPLANT
DERMABOND ADVANCED (GAUZE/BANDAGES/DRESSINGS) ×1
DERMABOND ADVANCED .7 DNX12 (GAUZE/BANDAGES/DRESSINGS) ×1 IMPLANT
GLOVE BIO SURGEON STRL SZ8 (GLOVE) ×2 IMPLANT
GLOVE ORTHO TXT STRL SZ7.5 (GLOVE) ×2 IMPLANT
GOWN PREVENTION PLUS LG XLONG (DISPOSABLE) ×6 IMPLANT
NEEDLE EPID 17G 5 ECHO TUOHY (NEEDLE) IMPLANT
NEEDLE INSUFFLATION 120MM (ENDOMECHANICALS) ×2 IMPLANT
NS IRRIG 1000ML POUR BTL (IV SOLUTION) ×2 IMPLANT
PACK LAPAROSCOPY BASIN (CUSTOM PROCEDURE TRAY) ×2 IMPLANT
POUCH SPECIMEN RETRIEVAL 10MM (ENDOMECHANICALS) IMPLANT
PROTECTOR NERVE ULNAR (MISCELLANEOUS) ×2 IMPLANT
SCALPEL HARMONIC ACE (MISCELLANEOUS) IMPLANT
SET IRRIG TUBING LAPAROSCOPIC (IRRIGATION / IRRIGATOR) IMPLANT
SOLUTION ELECTROLUBE (MISCELLANEOUS) IMPLANT
SUT VICRYL 0 UR6 27IN ABS (SUTURE) IMPLANT
SUT VICRYL 4-0 PS2 18IN ABS (SUTURE) ×2 IMPLANT
TOWEL OR 17X24 6PK STRL BLUE (TOWEL DISPOSABLE) ×4 IMPLANT
TRAY FOLEY CATH 14FR (SET/KITS/TRAYS/PACK) IMPLANT
TROCAR XCEL NON-BLD 11X100MML (ENDOMECHANICALS) ×2 IMPLANT
TROCAR XCEL NON-BLD 5MMX100MML (ENDOMECHANICALS) ×2 IMPLANT
TROCAR XCEL OPT SLVE 5M 100M (ENDOMECHANICALS) IMPLANT
WARMER LAPAROSCOPE (MISCELLANEOUS) ×2 IMPLANT
WATER STERILE IRR 1000ML POUR (IV SOLUTION) ×2 IMPLANT

## 2012-11-17 NOTE — Anesthesia Preprocedure Evaluation (Signed)
Anesthesia Evaluation  Patient identified by MRN, date of birth, ID band Patient awake    Reviewed: Allergy & Precautions, H&P , Patient's Chart, lab work & pertinent test results, reviewed documented beta blocker date and time   Airway Mallampati: II TM Distance: >3 FB Neck ROM: full    Dental no notable dental hx.    Pulmonary  breath sounds clear to auscultation  Pulmonary exam normal       Cardiovascular Rhythm:regular Rate:Normal     Neuro/Psych    GI/Hepatic   Endo/Other  Morbid obesity  Renal/GU      Musculoskeletal   Abdominal   Peds  Hematology   Anesthesia Other Findings   Reproductive/Obstetrics                          Anesthesia Physical Anesthesia Plan  ASA: III  Anesthesia Plan: General   Post-op Pain Management:    Induction: Intravenous  Airway Management Planned: Oral ETT  Additional Equipment:   Intra-op Plan:   Post-operative Plan:   Informed Consent: I have reviewed the patients History and Physical, chart, labs and discussed the procedure including the risks, benefits and alternatives for the proposed anesthesia with the patient or authorized representative who has indicated his/her understanding and acceptance.   Dental Advisory Given and Dental advisory given  Plan Discussed with: CRNA and Surgeon  Anesthesia Plan Comments: (  Discussed  general anesthesia, including possible nausea, instrumentation of airway, sore throat,pulmonary aspiration, etc. I asked if the were any outstanding questions, or  concerns before we proceeded. )       Anesthesia Quick Evaluation  

## 2012-11-17 NOTE — Anesthesia Postprocedure Evaluation (Signed)
  Anesthesia Post Note  Patient: Lisa [name]  Procedure(s) Performed: Procedure(s) (LRB): LAPAROSCOPY OPERATIVE (Bilateral) BILATERAL SALPINGECTOMY (Bilateral)  Anesthesia type: GA  Patient location: PACU  Post pain: Pain level controlled  Post assessment: Post-op Vital signs reviewed  Last Vitals:  Filed Vitals:   11/17/12 0830  BP: 109/67  Pulse: 66  Temp:   Resp: 18    Post vital signs: Reviewed  Level of consciousness: sedated  Complications: No apparent anesthesia complications

## 2012-11-17 NOTE — Transfer of Care (Signed)
Immediate Anesthesia Transfer of Care Note  Patient: Lisa [name]  Procedure(s) Performed: Procedure(s): LAPAROSCOPY OPERATIVE (Bilateral) BILATERAL SALPINGECTOMY (Bilateral)  Patient Location: PACU  Anesthesia Type:General  Level of Consciousness: awake, alert  and oriented  Airway & Oxygen Therapy: Patient Spontanous Breathing and Patient connected to nasal cannula oxygen  Post-op Assessment: Report given to PACU RN  Post vital signs: Reviewed  Complications: No apparent anesthesia complications

## 2012-11-17 NOTE — Preoperative (Signed)
Beta Blockers   Reason not to administer Beta Blockers:Not Applicable 

## 2012-11-17 NOTE — Interval H&P Note (Signed)
History and Physical Interval Note:  11/17/2012 7:10 AM  Lisa [name]  has presented today for surgery, with the diagnosis of Desires Sterilization   The various methods of treatment have been discussed with the patient and family. After consideration of risks, benefits and other options for treatment, the patient has consented to  Procedure(s): LAPAROSCOPY OPERATIVE (Bilateral) BILATERAL SALPINGECTOMY (Bilateral) as a surgical intervention .  The patient's history has been reviewed, patient examined, no change in status, stable for surgery.  I have reviewed the patient's chart and labs.  Questions were answered to the patient's satisfaction.     Riyanna Crutchley D

## 2012-11-17 NOTE — Op Note (Signed)
Preoperative diagnosis: Desires surgical sterility Postoperative diagnosis: Same Procedure: Laparoscopic bilateral distal salpingectomy Surgeon: Lavina Hamman M.D. Anesthesia: Gen. Endotracheal tube Findings: She had a normal abdomen and pelvis with normal uterus tubes and ovaries Specimens: Distal end of each fallopian tube Estimated blood loss: Minimal Complications: None  Procedure in detail  The patient was taken to the operating room and placed in the dorsosupine position. General anesthesia was induced. Her legs were placed in mobile stirrups and her left arm was tucked to her side. Abdomen perineum and vagina were then prepped and draped in the usual sterile fashion, a Foley catheter was inserted, a Hulka tenaculum was applied to the cervix for uterine manipulation. Infraumbilical skin was then infiltrated with quarter percent Marcaine and a 1 cm vertical incision was made. The veress needle was inserted into the peritoneal cavity and placement confirmed by the water drop test and an opening pressure of 7 mm of mercury. CO2 was insufflated to a pressure of 14 mm of mercury and the veress needle was removed. A 10/11 disposable trocar was then introduced with direct visualization with the laparoscope. A 5 mm port was then placed on the left side also under direct visualization. Inspection revealed the above-mentioned findings with normal anatomy. The distal end of each tube was grasped and elevated. Using bipolar cautery I was able to free the distal end of each tube from the ovary and fulgurate across a distal portion of the fallopian tube. Scissors were then used to remove the distal portion of the fallopian tube. A small amount of bleeding from the right side was controlled with bipolar cautery. This is done bilaterally without difficulty. Both segments of tube were removed through the umbilical trocar. The 5 mm port was removed under direct visualization. All gas was allowed to deflate from the  abdomen and the umbilical trocar was removed. One figure-of-eight suture of 0 Vicryl was placed in the umbilical incision. Skin incisions were then closed with interrupted subcuticular sutures of 4-0 Vicryl followed by Dermabond. The Hulka tenaculum and Foley were removed. The patient was taken down from stirrups. She was awakened in the operating room and taken to the recovery room in stable condition after tolerating the procedure well. Counts were correct and she had PAS hose on throughout the procedure.

## 2012-11-18 ENCOUNTER — Encounter (HOSPITAL_COMMUNITY): Payer: Self-pay | Admitting: Obstetrics and Gynecology

## 2013-01-03 IMAGING — CR DG CHEST 2V
2 series · 2 of 2 positions shown · non-contrast
Comparison: None.

CLINICAL DATA: Sudden onset of shortness of breath; history of
smoking.

CHEST - 2 VIEW

[w chest pa]
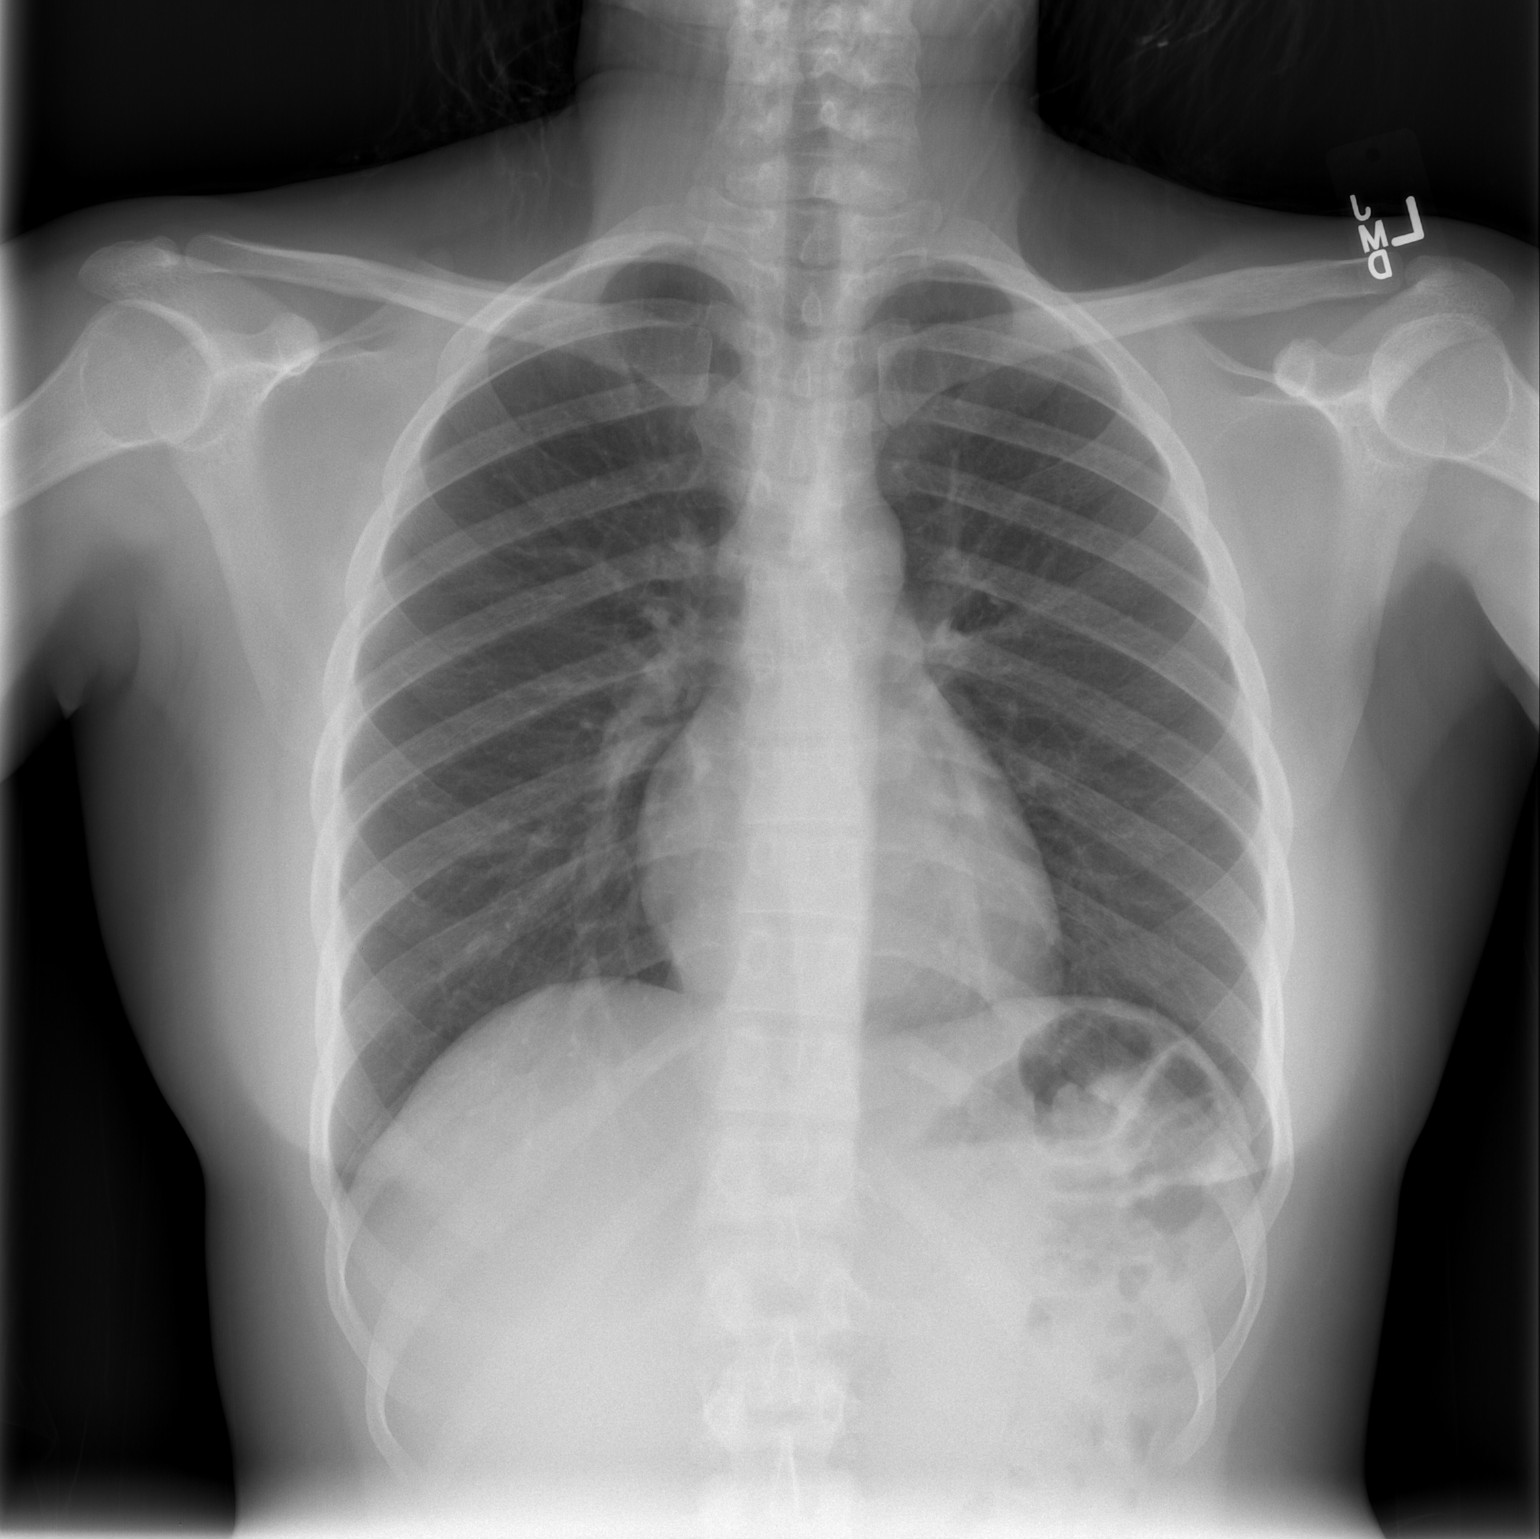

[w chest lat]
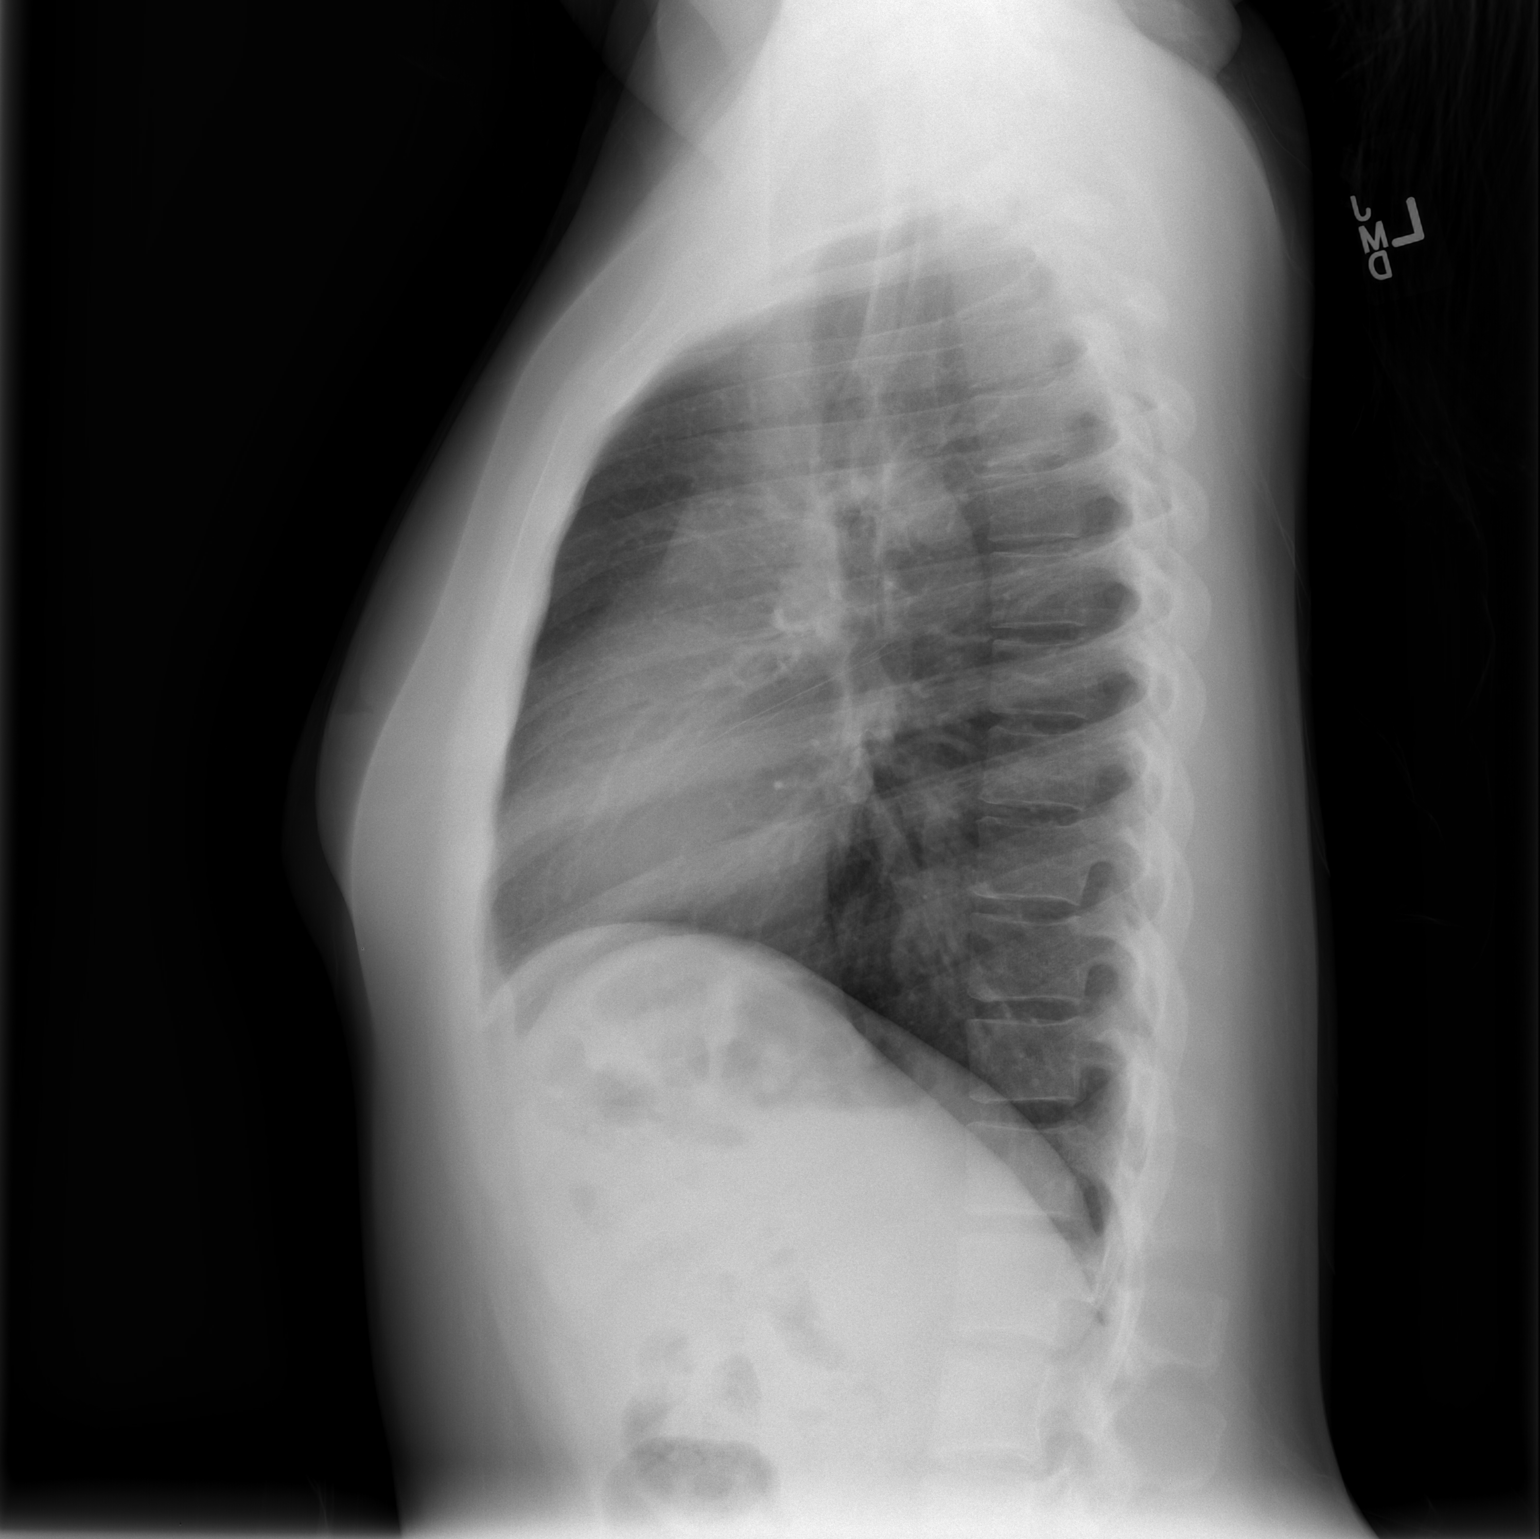

[2 of 2 positions shown; findings below may reference images not displayed]

FINDINGS: The lungs are well-aerated and clear.  There is no
evidence of focal opacification, pleural effusion or pneumothorax.

The heart is normal in size; the mediastinal contour is within
normal limits.  No acute osseous abnormalities are seen.
IMPRESSION: No acute cardiopulmonary process seen.

## 2014-07-23 ENCOUNTER — Encounter (HOSPITAL_COMMUNITY): Payer: Self-pay | Admitting: Obstetrics and Gynecology

## 2019-12-12 ENCOUNTER — Encounter (HOSPITAL_COMMUNITY): Payer: Self-pay | Admitting: Family Medicine

## 2019-12-12 ENCOUNTER — Ambulatory Visit (HOSPITAL_COMMUNITY)
Admission: EM | Admit: 2019-12-12 | Discharge: 2019-12-12 | Disposition: A | Discharge status: Home / Self Care | Payer: Self-pay | Attending: Family Medicine | Admitting: Family Medicine

## 2019-12-12 ENCOUNTER — Other Ambulatory Visit: Payer: Self-pay

## 2019-12-12 DIAGNOSIS — L03115 Cellulitis of right lower limb: Secondary | ICD-10-CM

## 2019-12-12 DIAGNOSIS — L0291 Cutaneous abscess, unspecified: Secondary | ICD-10-CM

## 2019-12-12 MED ORDER — DOXYCYCLINE HYCLATE 100 MG PO CAPS: 100.0000 mg | ORAL_CAPSULE | Freq: Two times a day (BID) | ORAL | 0 refills | Status: DC

## 2019-12-12 NOTE — ED Triage Notes (Signed)
Pt states she has a boil on her inner right thigh x 5 days. Pt state she can feel that it has spreaded in size.

## 2019-12-12 NOTE — Discharge Instructions (Addendum)
Take the antibiotic as prescribed Come back in 2 days if the infection is not improving or is worsening.  Otherwise you can take the packing out.  Follow up as needed for continued or worsening symptoms

## 2019-12-13 NOTE — ED Provider Notes (Signed)
[place]    CSN: [id number] Arrival date & time: 12/12/19  [place]      History   Chief Complaint Chief Complaint  Patient presents with  . Abscess    HPI Lisa [name] is a 39 y.o. female.   Patient is a 39 year old female presents today with abscess to right inner thigh.  This is been constant and worsening over the past 5 days.  She has had increased pain, swelling and spreading of the redness.  Denies any drainage.  Reporting she has been doing warm compresses and attempted to open up on her own with out any success.  Mild low-grade fever today with mild tachycardia.  Denies any nausea, vomiting, lightheadedness.  Denies any history of similar or history of MRSA.  ROS per HPI      Past Medical History:  Diagnosis Date  . Anemia    history with pregnancy  . Eczema   . Hx of mastitis   . Late prenatal care complicating pregnancy 09/26/6061  . No pertinent past medical history   . Normal pregnancy 09/26/2012  . SVD (spontaneous vaginal delivery) 09/27/2012    Patient Active Problem List   Diagnosis Date Noted  . Contraception management 11/17/2012  . SVD (spontaneous vaginal delivery) 09/27/2012  . Normal pregnancy 09/26/2012  . Late prenatal care complicating pregnancy 01/60/1093    Past Surgical History:  Procedure Laterality Date  . BILATERAL SALPINGECTOMY Bilateral 11/17/2012   Procedure: BILATERAL SALPINGECTOMY;  Surgeon: Cheri Fowler, MD;  Location: [facility];  Service: Gynecology;  Laterality: Bilateral;  . LAPAROSCOPY Bilateral 11/17/2012   Procedure: LAPAROSCOPY OPERATIVE;  Surgeon: Cheri Fowler, MD;  Location: [facility];  Service: Gynecology;  Laterality: Bilateral;  . NO PAST SURGERIES      OB History    Gravida  5   Para  5   Term  5   Preterm      AB      Living  5     SAB      TAB      Ectopic      Multiple      Live Births  5            Home Medications    Prior to Admission medications   Medication Sig Start  Date End Date Taking? Authorizing Provider  doxycycline (VIBRAMYCIN) 100 MG capsule Take 1 capsule (100 mg total) by mouth 2 (two) times daily. 12/12/19   Loura Halt A, NP  ibuprofen (ADVIL,MOTRIN) 800 MG tablet Take 1 tablet (800 mg total) by mouth every 8 (eight) hours as needed for pain. 09/29/12   Bovard-StuckertJeral Fruit, MD    Family History Family History  Problem Relation Age of Onset  . Diabetes Mother   . Cancer Mother   . Hypertension Mother   . Thyroid cancer Mother   . Cancer Father        bone  . Diabetes Father   . Hypertension Maternal Grandmother   . Cancer Paternal Grandmother        pancreas  . Cancer Maternal Aunt        pancreas  . COPD Maternal Aunt   . COPD Maternal Uncle     Social History Social History   Tobacco Use  . Smoking status: Current Some Day Smoker    Packs/day: 0.25    Years: 2.00    Pack years: 0.50    Types: Cigarettes  . Smokeless tobacco: Never Used  Substance Use Topics  .  Alcohol use: No  . Drug use: No     Allergies   Patient has no known allergies.   Review of Systems Review of Systems   Physical Exam Triage Vital Signs ED Triage Vitals  Enc Vitals Group     BP 12/12/19 1541 (!) 146/87     Pulse Rate 12/12/19 1541 (!) 116     Resp 12/12/19 1541 18     Temp 12/12/19 1541 99.2 F (37.3 C)     Temp Source 12/12/19 1541 Oral     SpO2 12/12/19 1541 99 %     Weight 12/12/19 1544 225 lb (102.1 kg)     Height --      Head Circumference --      Peak Flow --      Pain Score 12/12/19 1544 7     Pain Loc --      Pain Edu? --      Excl. in GC? --    No data found.  Updated Vital Signs BP (!) 146/87 (BP Location: Left Arm)   Pulse (!) 116   Temp 99.2 F (37.3 C) (Oral)   Resp 18   Wt 225 lb (102.1 kg)   LMP 11/21/2019   SpO2 99%   BMI 42.51 kg/m   Visual Acuity Right Eye Distance:   Left Eye Distance:   Bilateral Distance:    Right Eye Near:   Left Eye Near:    Bilateral Near:     Physical  Exam Vitals and nursing note reviewed.  Constitutional:      General: She is not in acute distress.    Appearance: Normal appearance. She is not ill-appearing, toxic-appearing or diaphoretic.  HENT:     Head: Normocephalic.     Nose: Nose normal.  Eyes:     Conjunctiva/sclera: Conjunctivae normal.  Pulmonary:     Effort: Pulmonary effort is normal.  Musculoskeletal:        General: Normal range of motion.     Cervical back: Normal range of motion.       Legs:     Comments: Approximately 2 to 3 cm abscess with centered fluctuance and surrounding cellulitis No active draining or bleeding  Skin:    General: Skin is warm and dry.     Findings: No rash.  Neurological:     Mental Status: She is alert.  Psychiatric:        Mood and Affect: Mood normal.      UC Treatments / Results  Labs (all labs ordered are listed, but only abnormal results are displayed) Labs Reviewed - No data to display  EKG   Radiology No results found.  Procedures Incision and Drainage  Date/Time: 12/13/2019 11:52 AM Performed by: Janace Aris, NP Authorized by: Janace Aris, NP   Consent:    Consent obtained:  Verbal   Consent given by:  Patient   Risks discussed:  Bleeding, incomplete drainage and pain   Alternatives discussed:  No treatment Universal protocol:    Patient identity confirmed:  Verbally with patient Location:    Type:  Abscess   Size:  3   Location:  Lower extremity   Lower extremity location:  Leg   Leg location:  R upper leg Pre-procedure details:    Skin preparation:  Betadine Anesthesia (see MAR for exact dosages):    Anesthesia method:  Local infiltration   Local anesthetic:  Lidocaine 2% w/o epi Procedure type:    Complexity:  Complex Procedure  details:    Needle aspiration: no     Incision types:  Single straight   Incision depth:  Subcutaneous   Scalpel blade:  11   Wound management:  Probed and deloculated   Drainage:  Purulent   Drainage amount:   Copious   Packing materials:  1/4 in gauze Post-procedure details:    Patient tolerance of procedure:  Tolerated well, no immediate complications   (including critical care time)  Medications Ordered in UC Medications - No data to display  Initial Impression / Assessment and Plan / UC Course  I have reviewed the triage vital signs and the nursing notes.  Pertinent labs & imaging results that were available during my care of the patient were reviewed by me and considered in my medical decision making (see chart for details).     Abscess with cellulitis-abscess I&D here in clinic with large amount of purulent, foul smelling drainage. Patient tolerated well. Placing patient on doxycycline for antibiotic coverage Packing in wound and recommended follow-up in 2 days for any continued or worsening problems. Final Clinical Impressions(s) / UC Diagnoses   Final diagnoses:  Abscess  Cellulitis of right lower extremity     Discharge Instructions     Take the antibiotic as prescribed Come back in 2 days if the infection is not improving or is worsening.  Otherwise you can take the packing out.  Follow up as needed for continued or worsening symptoms     ED Prescriptions    Medication Sig Dispense Auth. Provider   doxycycline (VIBRAMYCIN) 100 MG capsule Take 1 capsule (100 mg total) by mouth 2 (two) times daily. 20 capsule Dahlia Byes A, NP     PDMP not reviewed this encounter.   Janace Aris, NP 12/13/19 1153

## 2022-10-05 ENCOUNTER — Encounter: Payer: Self-pay | Admitting: *Deleted

## 2022-11-26 ENCOUNTER — Encounter: Payer: Self-pay | Admitting: Family Medicine

## 2022-12-22 ENCOUNTER — Other Ambulatory Visit (HOSPITAL_COMMUNITY)
Admission: RE | Admit: 2022-12-22 | Discharge: 2022-12-22 | Disposition: A | Discharge status: Home / Self Care | Payer: Medicaid Other | Source: Ambulatory Visit | Attending: Family Medicine | Admitting: Family Medicine

## 2022-12-22 ENCOUNTER — Ambulatory Visit (INDEPENDENT_AMBULATORY_CARE_PROVIDER_SITE_OTHER): Payer: Medicaid Other | Admitting: Obstetrics and Gynecology

## 2022-12-22 ENCOUNTER — Other Ambulatory Visit: Payer: Self-pay

## 2022-12-22 ENCOUNTER — Encounter: Payer: Self-pay | Admitting: Obstetrics and Gynecology

## 2022-12-22 VITALS — BP 115/76 | HR 87 | Ht 61.0 in | Wt 236.0 lb

## 2022-12-22 DIAGNOSIS — R87612 Low grade squamous intraepithelial lesion on cytologic smear of cervix (LGSIL): Secondary | ICD-10-CM | POA: Insufficient documentation

## 2022-12-22 DIAGNOSIS — Z01812 Encounter for preprocedural laboratory examination: Secondary | ICD-10-CM

## 2022-12-22 LAB — POCT PREGNANCY, URINE: Preg Test, Ur: NEGATIVE

## 2022-12-22 NOTE — Progress Notes (Signed)
    GYNECOLOGY CLINIC COLPOSCOPY PROCEDURE NOTE  42 y.o. [id number] here for colposcopy for low-grade squamous intraepithelial neoplasia (LGSIL - encompassing HPV,mild dysplasia,CIN I) pap smear on 1/24. Discussed role for HPV in cervical dysplasia, need for surveillance.  Patient given informed consent, signed copy in the chart, time out was performed.  Placed in lithotomy position. Cervix viewed with speculum and colposcope after application of acetic acid.   Colposcopy adequate? Yes  acetowhite lesion(s) noted at 6 & 12  o'clock; corresponding biopsies obtained.  ECC specimen obtained. Monsel's applied All specimens were labelled and sent to pathology.  Patient was given post procedure instructions.  Will follow up pathology and manage accordingly.  Routine preventative health maintenance measures emphasized.    Arlina Robes MD, [place] Attending [place] for [facility], [place]

## 2022-12-24 LAB — SURGICAL PATHOLOGY

## 2022-12-25 ENCOUNTER — Telehealth: Payer: Self-pay

## 2022-12-25 NOTE — Telephone Encounter (Addendum)
-----   Message from Hermina Staggers, MD sent at 12/25/2022 10:15 AM EDT ----- Please let Ms Wey know that her colposcopy confirmed her pap smear results. Recommend repeat pap smear in 1 yr.  Thanks [employer] pt; VM left stating I am calling with results and callback number given. Will attempt to contact patient a second time.

## 2022-12-30 ENCOUNTER — Encounter: Payer: Self-pay | Admitting: General Practice

## 2022-12-30 NOTE — Telephone Encounter (Signed)
Called patient, no answer- left message to call us back for results. Will send letter 

## 2022-12-31 ENCOUNTER — Telehealth: Payer: Self-pay | Admitting: *Deleted

## 2022-12-31 NOTE — Telephone Encounter (Signed)
Patient left a voicemail this am that she is calling about results from her procedure 4/2 with Dr. Alysia Penna. Nancy Fetter

## 2023-01-01 NOTE — Telephone Encounter (Signed)
Notified pt Dr. Alysia Penna recommendation -colposcopy confirmed her pap smear results. Recommend repeat pap smear in 1 yr.  Pt verbalized understanding.     Leonette Nutting

## 2023-07-01 ENCOUNTER — Encounter: Payer: Self-pay | Admitting: Obstetrics and Gynecology

## 2023-07-01 ENCOUNTER — Ambulatory Visit (INDEPENDENT_AMBULATORY_CARE_PROVIDER_SITE_OTHER): Payer: Medicaid Other | Admitting: Obstetrics and Gynecology

## 2023-07-01 VITALS — BP 108/70 | HR 94 | Ht 61.0 in | Wt 212.7 lb

## 2023-07-01 DIAGNOSIS — N951 Menopausal and female climacteric states: Secondary | ICD-10-CM | POA: Diagnosis not present

## 2023-07-01 DIAGNOSIS — R4586 Emotional lability: Secondary | ICD-10-CM | POA: Diagnosis not present

## 2023-07-01 DIAGNOSIS — Z1239 Encounter for other screening for malignant neoplasm of breast: Secondary | ICD-10-CM | POA: Diagnosis not present

## 2023-07-01 DIAGNOSIS — N939 Abnormal uterine and vaginal bleeding, unspecified: Secondary | ICD-10-CM

## 2023-07-01 NOTE — Progress Notes (Signed)
    GYNECOLOGY VISIT  Patient name: Lisa [name] MRN [id number]  Date of birth: 05-Aug-1981 Chief Complaint:   Gynecologic Exam   History:  Lisa [name] is a 42 y.o. [id number] being seen today for menstrual changes.  2 cycles a month for the last few months with hot flashes. Last pap in January with colpo in April.  Having cramping, tiredness             Has been having hot flashes and mood disruption/changes Has recently started phenterine and topiramate for weight management. Has been on this medication since May.  Smokes cigarettes currently.  Would be ok with a hysterectomy to deal with the bleeding Reports having difficulty with taking medication daily   Has done depo previously- gained a lot of weight. Does not want IUD.  [facility]   Past Medical History:  Diagnosis Date   Anemia    history with pregnancy   Eczema    Hx of mastitis    Late prenatal care complicating pregnancy 09/26/2012   No pertinent past medical history    Normal pregnancy 09/26/2012   SVD (spontaneous vaginal delivery) 09/27/2012    Past Surgical History:  Procedure Laterality Date   BILATERAL SALPINGECTOMY Bilateral 11/17/2012   Procedure: BILATERAL SALPINGECTOMY;  Surgeon: Lavina Hamman, MD;  Location: [facility];  Service: Gynecology;  Laterality: Bilateral;   LAPAROSCOPY Bilateral 11/17/2012   Procedure: LAPAROSCOPY OPERATIVE;  Surgeon: Lavina Hamman, MD;  Location: [facility];  Service: Gynecology;  Laterality: Bilateral;   NO PAST SURGERIES      The following portions of the patient's history were reviewed and updated as appropriate: allergies, current medications, past family history, past medical history, past social history, past surgical history and problem list.   Health Maintenance:   Last pap No results found for: "DIAGPAP", "HPVHIGH", "ADEQPAP"  Last mammogram: none on file   Review of Systems:  Pertinent items are noted in HPI. Comprehensive review of systems was  otherwise negative.   Objective:  Physical Exam BP 108/70   Pulse 94   Ht 5\' 1"  (1.549 m)   Wt 212 lb 11.2 oz (96.5 kg)   LMP 06/18/2023 (Approximate)   BMI 40.19 kg/m    Physical Exam   Labs and Imaging No results found.     Assessment & Plan:   1. Breast screening Mammogram ordered and scheduled - MM 3D SCREENING MAMMOGRAM BILATERAL BREAST; Future  2. Abnormal uterine bleeding (AUB) Discussed there are different medical and surgical interventions to manage bleeding. Recommend getting pelvic US to assess for any structural contributions to abnormal bleeding. She notes she would be very ok with a hysterectomy to manage the bleeding. Discussed pelvic US and EMB would be needed and that typically trial medications to manage bleeding prior to pursuing hysterectomy.  - US PELVIC COMPLETE WITH TRANSVAGINAL; Future  3. Perimenopausal vasomotor symptoms 4. Mood changes Discussed that there are different medications that can be used to address VSM and some are anti-depressants, which can also help with mood lability. Discussed checking current medication interactions with different anti-depressants. Noted that any medication to address mood and VSM would require some regular administration.  - citalopram (CELEXA) 20 MG tablet; Take 1 tablet (20 mg total) by mouth daily.  Dispense: 30 tablet; Refill: 1   Routine preventative health maintenance measures emphasized.  Lorriane Shire, MD Minimally Invasive Gynecologic Surgery Center for [facility], [facility]

## 2023-07-05 ENCOUNTER — Ambulatory Visit (HOSPITAL_COMMUNITY)
Admission: RE | Admit: 2023-07-05 | Discharge: 2023-07-05 | Disposition: A | Discharge status: Home / Self Care | Payer: Medicaid Other | Source: Ambulatory Visit | Attending: Obstetrics and Gynecology | Admitting: Obstetrics and Gynecology

## 2023-07-05 DIAGNOSIS — N939 Abnormal uterine and vaginal bleeding, unspecified: Secondary | ICD-10-CM | POA: Diagnosis present

## 2023-07-06 ENCOUNTER — Encounter: Payer: Self-pay | Admitting: Obstetrics and Gynecology

## 2023-07-06 MED ORDER — CITALOPRAM HYDROBROMIDE 20 MG PO TABS: 20.0000 mg | ORAL_TABLET | Freq: Every day | ORAL | 1 refills | Status: DC

## 2023-08-02 ENCOUNTER — Ambulatory Visit
Admission: RE | Admit: 2023-08-02 | Discharge: 2023-08-02 | Disposition: A | Discharge status: Home / Self Care | Payer: Medicaid Other | Source: Ambulatory Visit | Attending: Obstetrics and Gynecology | Admitting: Obstetrics and Gynecology

## 2023-08-02 DIAGNOSIS — Z1239 Encounter for other screening for malignant neoplasm of breast: Secondary | ICD-10-CM

## 2023-08-02 HISTORY — DX: Other signs and symptoms in breast: N64.59

## 2023-08-12 ENCOUNTER — Encounter: Payer: Self-pay | Admitting: Obstetrics and Gynecology

## 2023-08-12 ENCOUNTER — Other Ambulatory Visit (HOSPITAL_COMMUNITY)
Admission: RE | Admit: 2023-08-12 | Discharge: 2023-08-12 | Disposition: A | Discharge status: Home / Self Care | Payer: Medicaid Other | Source: Ambulatory Visit | Attending: Obstetrics and Gynecology | Admitting: Obstetrics and Gynecology

## 2023-08-12 ENCOUNTER — Other Ambulatory Visit: Payer: Self-pay

## 2023-08-12 ENCOUNTER — Ambulatory Visit: Payer: Medicaid Other | Admitting: Obstetrics and Gynecology

## 2023-08-12 VITALS — BP 115/72 | HR 87 | Wt 210.7 lb

## 2023-08-12 DIAGNOSIS — N939 Abnormal uterine and vaginal bleeding, unspecified: Secondary | ICD-10-CM | POA: Insufficient documentation

## 2023-08-12 DIAGNOSIS — Z1331 Encounter for screening for depression: Secondary | ICD-10-CM

## 2023-08-12 DIAGNOSIS — N951 Menopausal and female climacteric states: Secondary | ICD-10-CM

## 2023-08-12 DIAGNOSIS — R87612 Low grade squamous intraepithelial lesion on cytologic smear of cervix (LGSIL): Secondary | ICD-10-CM | POA: Diagnosis not present

## 2023-08-12 NOTE — Progress Notes (Signed)
GYNECOLOGY VISIT  Patient name: Lisa [name] MRN [id number]  Date of birth: Jul 10, 1981 Chief Complaint:   AUB follow up  History:  Lisa [name] is a 42 y.o. [id number] being seen today for AUB follow up.  She would like definitive management of abnormal bleeding. Continues depo and has intermittent heavy bleeding, typically when daughter cycling.   Last month on phetermine/topamax. Did not pick up celexa - did not want to start medication due to possible side effects  Works 2 jobs currently, one of which involves heavy bleeding   Past Medical History:  Diagnosis Date   Anemia    history with pregnancy   Eczema    Hx of mastitis    Inverted nipple    patient states she always had inverted nipples   Late prenatal care complicating pregnancy 09/26/2012   No pertinent past medical history    Normal pregnancy 09/26/2012   SVD (spontaneous vaginal delivery) 09/27/2012    Past Surgical History:  Procedure Laterality Date   BILATERAL SALPINGECTOMY Bilateral 11/17/2012   Procedure: BILATERAL SALPINGECTOMY;  Surgeon: Lavina Hamman, MD;  Location: [facility];  Service: Gynecology;  Laterality: Bilateral;   LAPAROSCOPY Bilateral 11/17/2012   Procedure: LAPAROSCOPY OPERATIVE;  Surgeon: Lavina Hamman, MD;  Location: [facility];  Service: Gynecology;  Laterality: Bilateral;   NO PAST SURGERIES      The following portions of the patient's history were reviewed and updated as appropriate: allergies, current medications, past family history, past medical history, past social history, past surgical history and problem list.   Health Maintenance:   Last pap LSIL, CIN 1 Colpo 12/2022 CIN 1  Last mammogram: 07/2023 BIRADS 1   Review of Systems:  Pertinent items are noted in HPI. Comprehensive review of systems was otherwise negative.   Objective:  Physical Exam BP 115/72   Pulse 87   Wt 210 lb 11.2 oz (95.6 kg)   LMP 08/03/2023 (Approximate)   BMI 39.81 kg/m    Physical Exam Vitals  and nursing note reviewed. Exam conducted with a chaperone present.  Constitutional:      Appearance: Normal appearance.  HENT:     Head: Normocephalic and atraumatic.  Pulmonary:     Effort: Pulmonary effort is normal.     Breath sounds: Normal breath sounds.  Genitourinary:    General: Normal vulva.     Exam position: Lithotomy position.     Vagina: Normal.     Cervix: Normal.  Skin:    General: Skin is warm and dry.  Neurological:     General: No focal deficit present.     Mental Status: She is alert.  Psychiatric:        Mood and Affect: Mood normal.        Behavior: Behavior normal.        Thought Content: Thought content normal.        Judgment: Judgment normal.      Labs and Imaging Uterus anteverted measuring 9 x 6 x 4 cm. Endometrium is poorly defined. The myometrium is heterogeneous. These findings suggest possible adenomyosis which can be evaluated further with pelvic MRI. Fundal intramural 1.0 cm fibroid noted.   Right ovary   Unremarkable, 2.0 x 1.9 x 1.7 cm.   Left ovary   Unremarkable, 2.6 x 1.8 x 1.7 cm.   Images of the adnexae demonstrated no fluid collections or masses. Ovaries demonstrated blood flow with color Doppler.   IMPRESSION: 1. Possible adenomyosis. Consider pelvic MRI for further evaluation. 2.  Intramural fibroid. 3. Endometrium not well visualized   Endometrial Biopsy Procedure  Patient identified, informed consent performed,  indication reviewed, consent signed.  Reviewed risk of perforation, pain, bleeding, insufficient sample, etc were reviewd. Time out was performed.  Urine pregnancy test negative.  Speculum placed in the vagina.  Cervix visualized.  Cleaned with Betadine x 2.  Anterior cervix grasped anteriorly with a single tooth tenaculum.  Paracervical block was administered.  Endometrial pipelle was used to draw up 1cc of 1% lidocaine, introduced into the cervical os and instilled into the endometrial cavity.  The pipelle was  passed twice without difficulty and sample obtained. Tenaculum was removed, good hemostasis noted.  Patient tolerated procedure well.  Patient was given post-procedure instructions.      Assessment & Plan:   1. Abnormal uterine bleeding (AUB) Discussed medical and surgical management options. Patient would like to proceed with definitive surgical management. No prior abdominal surgeries. Discussed preop visit prior to procedure closer to procedure date to re-review risks/benefits, sign H&P, etc.  - Surgical pathology  2. Perimenopausal vasomotor symptoms Not started on anti-depressant. Current tobacco use. Will consider starting medications following completion of current weight loss medications.   3. 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  The risks of surgery were discussed in detail with the patient including but not limited to: bleeding which may require transfusion or reoperation; infection which may require prolonged hospitalization or re-hospitalization and antibiotic therapy; injury to bowel, bladder, ureters and major vessels or other surrounding organs which may lead to other procedures; formation of adhesions; need for additional procedures including laparotomy or subsequent procedures secondary to intraoperative injury or abnormal pathology; thromboembolic phenomenon; incisional problems and other postoperative or anesthesia complications.  Patient was told that the likelihood that her condition and symptoms will be treated effectively with this surgical management was high; the postoperative expectations were also discussed in detail. The patient also understands the alternative treatment options which were discussed in full. All questions were answered.  She was told that she will be contacted by our surgical scheduler regarding the time and date of her surgery; routine preoperative instructions will be given to  her by the preoperative nursing team.    Printed patient education handouts about the procedure were given to the patient to review at home.   Lorriane Shire, MD Minimally Invasive Gynecologic Surgery Center for [facility], [facility]

## 2023-08-16 ENCOUNTER — Encounter: Payer: Self-pay | Admitting: General Practice

## 2023-08-16 LAB — SURGICAL PATHOLOGY

## 2023-08-17 ENCOUNTER — Encounter: Payer: Self-pay | Admitting: *Deleted

## 2023-09-30 ENCOUNTER — Encounter (HOSPITAL_BASED_OUTPATIENT_CLINIC_OR_DEPARTMENT_OTHER): Payer: Self-pay | Admitting: Obstetrics and Gynecology

## 2023-09-30 NOTE — Progress Notes (Signed)
 Your procedure is scheduled on  :  Tuesday,  10-12-2023  Report to [facility] AT  __8:15_ AM.   Call this number if you have problems the morning of surgery  [phone]. Any questions prior to surgery call pre-op nurse, Tyhesha Dutson:  [phone]   OUR ADDRESS IS [address].  WE ARE LOCATED IN [facility]  PLEASE BRING YOUR INSURANCE CARD AND PHOTO ID DAY OF SURGERY.                                     REMEMBER: Do not eat food after midnight night before surgery.  You may have clear liquid diet from midnight night before surgery until 7:15 AM.   NO clear liquids after 7:15 AM day of surgery.  This includes no water ,  candy,  gum,  and mints.   Please brush your teeth morning of surgery and rinse mouth out.   CLEAR LIQUID DIET  Allowed      Water                                                                    Coffee and tea, regular and decaf  (NO cream or milk products of any type, may sweeten, no honey)                         Carbonated beverages, regular and diet                                    Sports drinks like Gatorade _____________________________________________________________________     TAKE ONLY THESE MEDICATIONS MORNING OF SURGERY:   NONE                                        DO NOT WEAR JEWERLY/  METAL/  PIERCINGS (INCLUDING NO PLASTIC PIERCINGS) DO NOT WEAR LOTIONS, POWDERS, PERFUMES OR NAIL POLISH ON YOUR FINGERNAILS. TOENAIL POLISH IS OK TO WEAR. DO NOT SHAVE FOR 48 HOURS PRIOR TO DAY OF SURGERY.  CONTACTS, GLASSES, OR DENTURES MAY NOT BE WORN TO SURGERY.  REMEMBER: NO SMOKING, VAPING ,  DRUGS OR ALCOHOL FOR 24 HOURS BEFORE YOUR SURGERY.                                    [facility] IS NOT RESPONSIBLE  FOR ANY BELONGINGS.                                                                    SAB[facility]           [facility] - Preparing for Surgery Before surgery, you can  play an important role.  Because  skin is not sterile, your skin needs to be as free of germs as possible.  You can reduce the number of germs on your skin by washing with CHG (chlorahexidine gluconate) soap before surgery.  CHG is an antiseptic cleaner which kills germs and bonds with the skin to continue killing germs even after washing. Please DO NOT use if you have an allergy to CHG or antibacterial soaps.  If your skin becomes reddened/irritated stop using the CHG and inform your nurse when you arrive at Short Stay. Do not shave (including legs and underarms) for at least 48 hours prior to the first CHG shower.  You may shave your face/neck. Please follow these instructions carefully:  1.  Shower with CHG Soap the night before surgery and the  morning of Surgery.  2.  If you choose to wash your hair, wash your hair first as usual with your  normal  shampoo.  3.  After you shampoo, rinse your hair and body thoroughly to remove the  shampoo.                                        4.  Use CHG as you would any other liquid soap.  You can apply chg directly  to the skin and wash , chg soap provided, night before and morning of your surgery.  5.  Apply the CHG Soap to your body ONLY FROM THE NECK DOWN.   Do not use on face/ open                           Wound or open sores. Avoid contact with eyes, ears mouth and genitals (private parts).                       Wash face,  Genitals (private parts) with your normal soap.             6.  Wash thoroughly, paying special attention to the area where your surgery  will be performed.  7.  Thoroughly rinse your body with warm water  from the neck down.  8.  DO NOT shower/wash with your normal soap after using and rinsing off  the CHG Soap.             9.  Pat yourself dry with a clean towel.            10.  Wear clean pajamas.            11.  Place clean sheets on your bed the night of your first shower and do not  sleep with pets. Day of Surgery : Do not apply any lotions/ powders the  morning of surgery.  Please wear clean clothes to the hospital/surgery center.  IF YOU HAVE ANY SKIN IRRITATION OR PROBLEMS WITH THE SURGICAL SOAP, PLEASE GET A BAR OF GOLD DIAL SOAP AND SHOWER THE NIGHT BEFORE YOUR SURGERY AND THE MORNING OF YOUR SURGERY. PLEASE LET THE NURSE KNOW MORNING OF YOUR SURGERY IF YOU HAD ANY PROBLEMS WITH THE SURGICAL SOAP.   YOUR SURGEON MAY HAVE REQUESTED EXTENDED RECOVERY TIME AFTER YOUR SURGERY. IT COULD BE A  JUST A FEW HOURS  UP TO AN OVERNIGHT STAY.  YOUR SURGEON SHOULD HAVE DISCUSSED THIS WITH YOU PRIOR TO YOUR SURGERY. IN THE  EVENT YOU NEED TO STAY OVERNIGHT PLEASE REFER TO THE FOLLOWING GUIDELINES. YOU MAY HAVE UP TO 4 VISITORS  MAY VISIT IN THE EXTENDED RECOVERY ROOM UNTIL 800 PM ONLY.  ONE  VISITOR AGE 71 AND OVER MAY SPEND THE NIGHT AND MUST BE IN EXTENDED RECOVERY ROOM NO LATER THAN 800 PM . YOUR DISCHARGE TIME AFTER YOU SPEND THE NIGHT IS 900 AM THE MORNING AFTER YOUR SURGERY. YOU MAY PACK A SMALL OVERNIGHT BAG WITH TOILETRIES FOR YOUR OVERNIGHT STAY IF YOU WISH.  REGARDLESS OF IF YOU STAY OVER NIGHT OR ARE DISCHARGED THE SAME DAY YOU WILL BE REQUIRED TO HAVE A RESPONSIBLE ADULT (18 YRS OLD OR OLDER) STAY WITH YOU FOR AT LEAST THE FIRST 24 HOURS WHEN HOME  YOUR PRESCRIPTION MEDICATIONS WILL BE PROVIDED DURING [facility] STAY.  ________________________________________________________________________

## 2023-09-30 NOTE — Progress Notes (Addendum)
 Spoke w/ via phone for pre-op interview--- pt Lab needs dos----  urine preg       Lab results------ lab appt 10-07-2023 @ 1415 getting CBC/ T&S COVID test -----patient states asymptomatic no test needed Arrive at ------- 0815 on 10-12-2023 NPO after MN NO Solid Food.  Clear liquids from MN until--- 0715 Med rec completed Medications to take morning of surgery ----- none Diabetic medication ----- n/a Patient instructed no nail polish to be worn day of surgery Patient instructed to bring photo id and insurance card day of surgery Patient aware to have Driver (ride ) / caregiver    for 24 hours after surgery - mother, mary Patient Special Instructions ----- will pick up bag w/ hibiclens and written instructions at lab appt Asked to call with any questions.  Pt verbalized understanding to stop taking phentermine today 09-30-2023.  Also, pt stated was not given any instructions about wegovy (it was not on her medication list).  Pt verbalized understanding to not do her wegovy until after her surgery,  stated last dose 09-28-2023. Pre-Op special Instructions -----  Patient verbalized understanding of instructions that were given at this phone interview. Patient denies chest pain, sob, fever, cough at the interview.

## 2023-10-07 ENCOUNTER — Encounter (HOSPITAL_COMMUNITY)
Admission: RE | Admit: 2023-10-07 | Discharge: 2023-10-07 | Disposition: A | Discharge status: Home / Self Care | Payer: Medicaid Other | Source: Ambulatory Visit | Attending: Obstetrics and Gynecology | Admitting: Obstetrics and Gynecology

## 2023-10-07 DIAGNOSIS — Z01812 Encounter for preprocedural laboratory examination: Secondary | ICD-10-CM | POA: Diagnosis present

## 2023-10-07 DIAGNOSIS — Z01818 Encounter for other preprocedural examination: Secondary | ICD-10-CM

## 2023-10-07 LAB — CBC
HCT: 35.2 % — ABNORMAL LOW (ref 36.0–46.0)
Hemoglobin: 11.3 g/dL — ABNORMAL LOW (ref 12.0–15.0)
MCH: 29.8 pg (ref 26.0–34.0)
MCHC: 32.1 g/dL (ref 30.0–36.0)
MCV: 92.9 fL (ref 80.0–100.0)
Platelets: 424 10*3/uL — ABNORMAL HIGH (ref 150–400)
RBC: 3.79 MIL/uL — ABNORMAL LOW (ref 3.87–5.11)
RDW: 14.5 % (ref 11.5–15.5)
WBC: 7.2 10*3/uL (ref 4.0–10.5)
nRBC: 0 % (ref 0.0–0.2)

## 2023-10-12 ENCOUNTER — Ambulatory Visit (HOSPITAL_BASED_OUTPATIENT_CLINIC_OR_DEPARTMENT_OTHER): Payer: Medicaid Other | Admitting: Anesthesiology

## 2023-10-12 ENCOUNTER — Encounter (HOSPITAL_BASED_OUTPATIENT_CLINIC_OR_DEPARTMENT_OTHER): Payer: Self-pay | Admitting: Obstetrics and Gynecology

## 2023-10-12 ENCOUNTER — Other Ambulatory Visit: Payer: Self-pay

## 2023-10-12 ENCOUNTER — Encounter (HOSPITAL_BASED_OUTPATIENT_CLINIC_OR_DEPARTMENT_OTHER)
Admission: RE | Disposition: A | Discharge status: Home / Self Care | Payer: Self-pay | Source: Home / Self Care | Attending: Obstetrics and Gynecology

## 2023-10-12 ENCOUNTER — Ambulatory Visit (HOSPITAL_BASED_OUTPATIENT_CLINIC_OR_DEPARTMENT_OTHER)
Admission: RE | Admit: 2023-10-12 | Discharge: 2023-10-12 | Disposition: A | Discharge status: Home / Self Care | Payer: Medicaid Other | Attending: Obstetrics and Gynecology | Admitting: Obstetrics and Gynecology

## 2023-10-12 ENCOUNTER — Telehealth: Payer: Self-pay | Admitting: Obstetrics and Gynecology

## 2023-10-12 DIAGNOSIS — E66813 Obesity, class 3: Secondary | ICD-10-CM | POA: Insufficient documentation

## 2023-10-12 DIAGNOSIS — D259 Leiomyoma of uterus, unspecified: Secondary | ICD-10-CM | POA: Insufficient documentation

## 2023-10-12 DIAGNOSIS — D251 Intramural leiomyoma of uterus: Secondary | ICD-10-CM

## 2023-10-12 DIAGNOSIS — Z01818 Encounter for other preprocedural examination: Secondary | ICD-10-CM

## 2023-10-12 DIAGNOSIS — N938 Other specified abnormal uterine and vaginal bleeding: Secondary | ICD-10-CM | POA: Diagnosis not present

## 2023-10-12 DIAGNOSIS — Z6839 Body mass index (BMI) 39.0-39.9, adult: Secondary | ICD-10-CM | POA: Diagnosis not present

## 2023-10-12 DIAGNOSIS — F17210 Nicotine dependence, cigarettes, uncomplicated: Secondary | ICD-10-CM | POA: Diagnosis not present

## 2023-10-12 DIAGNOSIS — N858 Other specified noninflammatory disorders of uterus: Secondary | ICD-10-CM | POA: Diagnosis not present

## 2023-10-12 DIAGNOSIS — N8003 Adenomyosis of the uterus: Secondary | ICD-10-CM | POA: Diagnosis not present

## 2023-10-12 DIAGNOSIS — N939 Abnormal uterine and vaginal bleeding, unspecified: Secondary | ICD-10-CM | POA: Insufficient documentation

## 2023-10-12 HISTORY — DX: Personal history of diseases of the blood and blood-forming organs and certain disorders involving the immune mechanism: Z86.2

## 2023-10-12 HISTORY — DX: Personal history of cervical dysplasia: Z87.410

## 2023-10-12 HISTORY — PX: TOTAL LAPAROSCOPIC HYSTERECTOMY WITH SALPINGECTOMY: SHX6742

## 2023-10-12 HISTORY — DX: Presence of spectacles and contact lenses: Z97.3

## 2023-10-12 HISTORY — PX: CYSTOSCOPY: SHX5120

## 2023-10-12 HISTORY — DX: Abnormal uterine and vaginal bleeding, unspecified: N93.9

## 2023-10-12 LAB — POCT PREGNANCY, URINE: Preg Test, Ur: NEGATIVE

## 2023-10-12 LAB — TYPE AND SCREEN
ABO/RH(D): O POS
Antibody Screen: NEGATIVE

## 2023-10-12 SURGERY — HYSTERECTOMY, TOTAL, LAPAROSCOPIC, WITH SALPINGECTOMY
Anesthesia: General | Site: Bladder

## 2023-10-12 MED ORDER — HYDROMORPHONE HCL 1 MG/ML IJ SOLN
0.2500 mg | INTRAMUSCULAR | Status: DC | PRN
Start: 2023-10-12 — End: 2023-10-12
  Administered 2023-10-12: 0.25 mg via INTRAVENOUS

## 2023-10-12 MED ORDER — SCOPOLAMINE 1 MG/3DAYS TD PT72
MEDICATED_PATCH | TRANSDERMAL | Status: AC
  Filled 2023-10-12: qty 1

## 2023-10-12 MED ORDER — ONDANSETRON HCL 4 MG/2ML IJ SOLN
INTRAMUSCULAR | Status: DC | PRN
  Administered 2023-10-12: 4 mg via INTRAVENOUS

## 2023-10-12 MED ORDER — PROPOFOL 10 MG/ML IV BOLUS
INTRAVENOUS | Status: AC
Start: 2023-10-12 — End: ?
  Filled 2023-10-12: qty 20

## 2023-10-12 MED ORDER — ONDANSETRON HCL 4 MG/2ML IJ SOLN
INTRAMUSCULAR | Status: AC
  Filled 2023-10-12: qty 2

## 2023-10-12 MED ORDER — FENTANYL CITRATE (PF) 100 MCG/2ML IJ SOLN
INTRAMUSCULAR | Status: DC | PRN
  Administered 2023-10-12 (×3): 50 ug via INTRAVENOUS
  Administered 2023-10-12: 100 ug via INTRAVENOUS

## 2023-10-12 MED ORDER — PROPOFOL 10 MG/ML IV BOLUS
INTRAVENOUS | Status: DC | PRN
  Administered 2023-10-12: 60 mg via INTRAVENOUS
  Administered 2023-10-12: 140 mg via INTRAVENOUS

## 2023-10-12 MED ORDER — OXYCODONE HCL 5 MG PO TABS: 5.0000 mg | ORAL_TABLET | ORAL | 0 refills | Status: DC | PRN

## 2023-10-12 MED ORDER — SUGAMMADEX SODIUM 200 MG/2ML IV SOLN
INTRAVENOUS | Status: DC | PRN
  Administered 2023-10-12: 200 mg via INTRAVENOUS

## 2023-10-12 MED ORDER — BUPIVACAINE HCL (PF) 0.5 % IJ SOLN
INTRAMUSCULAR | Status: DC | PRN
  Administered 2023-10-12: 16 mL
  Administered 2023-10-12: 10 mL

## 2023-10-12 MED ORDER — HEMOSTATIC AGENTS (NO CHARGE) OPTIME
TOPICAL | Status: DC | PRN
  Administered 2023-10-12: 1 via TOPICAL

## 2023-10-12 MED ORDER — ACETAMINOPHEN 500 MG PO TABS: 1000.0000 mg | ORAL_TABLET | ORAL | Status: DC

## 2023-10-12 MED ORDER — DEXAMETHASONE SODIUM PHOSPHATE 10 MG/ML IJ SOLN
INTRAMUSCULAR | Status: AC
  Filled 2023-10-12: qty 1

## 2023-10-12 MED ORDER — OXYCODONE HCL 5 MG PO TABS
ORAL_TABLET | ORAL | Status: AC
  Filled 2023-10-12: qty 1

## 2023-10-12 MED ORDER — GABAPENTIN 300 MG PO CAPS
ORAL_CAPSULE | ORAL | Status: AC
Start: 2023-10-12 — End: ?
  Filled 2023-10-12: qty 1

## 2023-10-12 MED ORDER — ACETAMINOPHEN 500 MG PO TABS
1000.0000 mg | ORAL_TABLET | Freq: Once | ORAL | Status: AC
Start: 2023-10-12 — End: 2023-10-12
  Administered 2023-10-12: 1000 mg via ORAL

## 2023-10-12 MED ORDER — SCOPOLAMINE 1 MG/3DAYS TD PT72SCOPOLAMINE 1 MG/3DAYS
1.0000 | MEDICATED_PATCH | TRANSDERMAL | Status: DC
Start: 2023-10-12 — End: 2023-10-12
  Administered 2023-10-12: 1.5 mg via TRANSDERMAL

## 2023-10-12 MED ORDER — MIDAZOLAM HCL 5 MG/5ML IJ SOLN
INTRAMUSCULAR | Status: DC | PRN
  Administered 2023-10-12: 2 mg via INTRAVENOUS

## 2023-10-12 MED ORDER — KETOROLAC TROMETHAMINE 30 MG/ML IJ SOLN
INTRAMUSCULAR | Status: DC | PRN
  Administered 2023-10-12: 30 mg via INTRAVENOUS

## 2023-10-12 MED ORDER — ACETAMINOPHEN 500 MG PO TABS: 1000.0000 mg | ORAL_TABLET | Freq: Three times a day (TID) | ORAL | 1 refills | Status: AC | PRN

## 2023-10-12 MED ORDER — HYDROMORPHONE HCL 1 MG/ML IJ SOLN
INTRAMUSCULAR | Status: AC
  Filled 2023-10-12: qty 1

## 2023-10-12 MED ORDER — ACETAMINOPHEN 500 MG PO TABS
ORAL_TABLET | ORAL | Status: AC
Start: 2023-10-12 — End: ?
  Filled 2023-10-12: qty 2

## 2023-10-12 MED ORDER — LIDOCAINE HCL (PF) 2 % IJ SOLN
INTRAMUSCULAR | Status: AC
  Filled 2023-10-12: qty 5

## 2023-10-12 MED ORDER — STERILE WATER FOR IRRIGATION IR SOLN
Status: DC | PRN
  Administered 2023-10-12: 500 mL

## 2023-10-12 MED ORDER — CEFAZOLIN SODIUM-DEXTROSE 2-4 GM/100ML-% IV SOLN
2.0000 g | INTRAVENOUS | Status: AC
  Administered 2023-10-12: 2 g via INTRAVENOUS

## 2023-10-12 MED ORDER — LACTATED RINGERS IV SOLN: INTRAVENOUS | Status: DC

## 2023-10-12 MED ORDER — ROCURONIUM BROMIDE 100 MG/10ML IV SOLN
INTRAVENOUS | Status: DC | PRN
  Administered 2023-10-12: 70 mg via INTRAVENOUS
  Administered 2023-10-12: 10 mg via INTRAVENOUS

## 2023-10-12 MED ORDER — CEFAZOLIN SODIUM-DEXTROSE 2-4 GM/100ML-% IV SOLN
INTRAVENOUS | Status: AC
Start: 2023-10-12 — End: ?
  Filled 2023-10-12: qty 100

## 2023-10-12 MED ORDER — FENTANYL CITRATE (PF) 250 MCG/5ML IJ SOLN
INTRAMUSCULAR | Status: AC
  Filled 2023-10-12: qty 5

## 2023-10-12 MED ORDER — ROCURONIUM BROMIDE 10 MG/ML (PF) SYRINGE
PREFILLED_SYRINGE | INTRAVENOUS | Status: AC
  Filled 2023-10-12: qty 10

## 2023-10-12 MED ORDER — GABAPENTIN 300 MG PO CAPS
300.0000 mg | ORAL_CAPSULE | ORAL | Status: AC
  Administered 2023-10-12: 300 mg via ORAL

## 2023-10-12 MED ORDER — MIDAZOLAM HCL 2 MG/2ML IJ SOLN
INTRAMUSCULAR | Status: AC
  Filled 2023-10-12: qty 2

## 2023-10-12 MED ORDER — DEXAMETHASONE SODIUM PHOSPHATE 4 MG/ML IJ SOLN
INTRAMUSCULAR | Status: DC | PRN
  Administered 2023-10-12: 5 mg via INTRAVENOUS

## 2023-10-12 MED ORDER — POVIDONE-IODINE 10 % EX SWAB
2.0000 | Freq: Once | CUTANEOUS | Status: AC
  Administered 2023-10-12: 2 via TOPICAL

## 2023-10-12 MED ORDER — LIDOCAINE HCL (CARDIAC) PF 100 MG/5ML IV SOSY
PREFILLED_SYRINGE | INTRAVENOUS | Status: DC | PRN
  Administered 2023-10-12: 60 mg via INTRAVENOUS

## 2023-10-12 MED ORDER — IBUPROFEN 800 MG PO TABS: 800.0000 mg | ORAL_TABLET | Freq: Three times a day (TID) | ORAL | 1 refills | Status: DC | PRN

## 2023-10-12 MED ORDER — SODIUM CHLORIDE 0.9 % IR SOLN
Status: DC | PRN
  Administered 2023-10-12: 1000 mL

## 2023-10-12 MED ORDER — OXYCODONE HCL 5 MG PO TABS
5.0000 mg | ORAL_TABLET | Freq: Once | ORAL | Status: AC
  Administered 2023-10-12: 5 mg via ORAL

## 2023-10-12 SURGICAL SUPPLY — 62 items
APPLICATOR ARISTA FLEXITIP XL (MISCELLANEOUS) IMPLANT
BLADE SURG 10 STRL SS (BLADE) IMPLANT
CABLE HIGH FREQUENCY MONO STRZ (ELECTRODE) IMPLANT
CHLORAPREP W/TINT 26 (MISCELLANEOUS) ×2 IMPLANT
COVER BACK TABLE 60X90IN (DRAPES) ×2 IMPLANT
COVER MAYO STAND STRL (DRAPES) ×2 IMPLANT
DERMABOND ADVANCED .7 DNX12 (GAUZE/BANDAGES/DRESSINGS) ×2 IMPLANT
DILATOR CANAL MILEX (MISCELLANEOUS) IMPLANT
DRAPE SURG IRRIG POUCH 19X23 (DRAPES) ×2 IMPLANT
GAUZE 4X4 16PLY ~~LOC~~+RFID DBL (SPONGE) ×2 IMPLANT
GLOVE BIO SURGEON STRL SZ7 (GLOVE) ×4 IMPLANT
GLOVE BIOGEL PI IND STRL 7.0 (GLOVE) ×4 IMPLANT
GOWN STRL REUS W/ TWL XL LVL3 (GOWN DISPOSABLE) ×4 IMPLANT
HARMONIC RUM II 2.5CM SILVER (DISPOSABLE) IMPLANT
HARMONIC RUM II 3.0CM SILVER (DISPOSABLE) IMPLANT
HARMONIC RUM II 3.5CM SILVER (DISPOSABLE) ×2 IMPLANT
HARMONIC RUM II 4.0CM SILVER (DISPOSABLE) IMPLANT
HEMOSTAT ARISTA ABSORB 3G PWDR (HEMOSTASIS) IMPLANT
IRRIG SUCT STRYKERFLOW 2 WTIP (MISCELLANEOUS) ×2 IMPLANT
IRRIGATION SUCT STRKRFLW 2 WTP (MISCELLANEOUS) ×2 IMPLANT
IV NS 1000ML BAXH (IV SOLUTION) IMPLANT
KIT PINK PAD W/HEAD ARE REST (MISCELLANEOUS) ×2 IMPLANT
KIT PINK PAD W/HEAD ARM REST (MISCELLANEOUS) ×2 IMPLANT
KIT TURNOVER CYSTO (KITS) ×2 IMPLANT
LIGASURE VESSEL 5MM BLUNT TIP (ELECTROSURGICAL) IMPLANT
NDL INSUFFLATION 14GA 120MM (NEEDLE) IMPLANT
NDL SPNL 22GX3.5 QUINCKE BK (NEEDLE) ×2 IMPLANT
NEEDLE INSUFFLATION 14GA 120MM (NEEDLE) ×2 IMPLANT
NEEDLE SPNL 22GX3.5 QUINCKE BK (NEEDLE) ×2 IMPLANT
NS IRRIG 1000ML POUR BTL (IV SOLUTION) ×2 IMPLANT
OCCLUDER COLPOPNEUMO (BALLOONS) IMPLANT
PACK LAPAROSCOPY BASIN (CUSTOM PROCEDURE TRAY) ×2 IMPLANT
POUCH LAPAROSCOPIC INSTRUMENT (MISCELLANEOUS) ×2 IMPLANT
PROTECTOR NERVE ULNAR (MISCELLANEOUS) ×2 IMPLANT
SCALPEL HRMNC RUM II 2.5 SILVR (DISPOSABLE) IMPLANT
SCALPEL HRMNC RUM II 3.0 SILVR (DISPOSABLE) IMPLANT
SCALPEL HRMNC RUM II 3.5 SILVR (DISPOSABLE) IMPLANT
SCALPEL HRMNC RUM II 4.0 SILVR (DISPOSABLE) IMPLANT
SCISSORS LAP 5X35 DISP (ENDOMECHANICALS) IMPLANT
SET IRRIG Y TYPE TUR BLADDER L (SET/KITS/TRAYS/PACK) ×2 IMPLANT
SET TRI-LUMEN FLTR TB AIRSEAL (TUBING) ×2 IMPLANT
SHEARS HARMONIC 36 ACE (MISCELLANEOUS) ×2 IMPLANT
SLEEVE SCD COMPRESS KNEE MED (STOCKING) ×2 IMPLANT
SLEEVE Z-THREAD 5X100MM (TROCAR) ×4 IMPLANT
SUT VIC AB 0 CT1 27XBRD ANBCTR (SUTURE) IMPLANT
SUT VIC AB 4-0 PS2 18 (SUTURE) ×2 IMPLANT
SUT VICRYL 0 UR6 27IN ABS (SUTURE) IMPLANT
SUT VLOC 180 0 9IN GS21 (SUTURE) ×2 IMPLANT
SYR 10ML LL (SYRINGE) ×2 IMPLANT
SYR 50ML LL SCALE MARK (SYRINGE) ×4 IMPLANT
SYR CONTROL 10ML LL (SYRINGE) ×2 IMPLANT
TIP UTERINE 5.1X6CM LAV DISP (MISCELLANEOUS) IMPLANT
TIP UTERINE 6.7X10CM GRN DISP (MISCELLANEOUS) IMPLANT
TIP UTERINE 6.7X6CM WHT DISP (MISCELLANEOUS) IMPLANT
TIP UTERINE 6.7X8CM BLUE DISP (MISCELLANEOUS) IMPLANT
TOWEL OR 17X24 6PK STRL BLUE (TOWEL DISPOSABLE) ×2 IMPLANT
TRAY FOLEY W/BAG SLVR 14FR LF (SET/KITS/TRAYS/PACK) ×2 IMPLANT
TROCAR ADV FIXATION 5X100MM (TROCAR) IMPLANT
TROCAR PORT AIRSEAL 8X120 (TROCAR) ×2 IMPLANT
TROCAR Z-THREAD FIOS 5X100MM (TROCAR) ×2 IMPLANT
WARMER LAPAROSCOPE (MISCELLANEOUS) ×2 IMPLANT
WATER STERILE IRR 500ML POUR (IV SOLUTION) IMPLANT

## 2023-10-12 NOTE — Anesthesia Preprocedure Evaluation (Addendum)
Anesthesia Evaluation  Patient identified by MRN, date of birth, ID band Patient awake    Reviewed: Allergy & Precautions, H&P , NPO status , Patient's Chart, lab work & pertinent test results  Airway Mallampati: II  TM Distance: >3 FB Neck ROM: Full    Dental no notable dental hx. (+) Teeth Intact, Dental Advisory Given   Pulmonary Current Smoker   Pulmonary exam normal breath sounds clear to auscultation       Cardiovascular negative cardio ROS  Rhythm:Regular Rate:Normal     Neuro/Psych negative neurological ROS  negative psych ROS   GI/Hepatic negative GI ROS, Neg liver ROS,,,  Endo/Other    Class 3 obesity  Renal/GU negative Renal ROS  negative genitourinary   Musculoskeletal   Abdominal   Peds  Hematology negative hematology ROS (+)   Anesthesia Other Findings   Reproductive/Obstetrics negative OB ROS                             Anesthesia Physical Anesthesia Plan  ASA: 2  Anesthesia Plan: General   Post-op Pain Management: Tylenol PO (pre-op)* and Toradol IV (intra-op)*   Induction: Intravenous  PONV Risk Score and Plan: 3 and Ondansetron, Dexamethasone and Midazolam  Airway Management Planned: Oral ETT  Additional Equipment:   Intra-op Plan:   Post-operative Plan: Extubation in OR  Informed Consent: I have reviewed the patients History and Physical, chart, labs and discussed the procedure including the risks, benefits and alternatives for the proposed anesthesia with the patient or authorized representative who has indicated his/her understanding and acceptance.     Dental advisory given  Plan Discussed with: CRNA  Anesthesia Plan Comments:        Anesthesia Quick Evaluation

## 2023-10-12 NOTE — Op Note (Signed)
[facility] PROCEDURE DATE: 10/12/2023  PREOPERATIVE DIAGNOSIS: abnormal uterine bleeding  POSTOPERATIVE DIAGNOSIS: abnormal uterine bleeding PROCEDURE:    total laparoscopic  hysterectomy, bilateral salpingectomy, cystoscopy SURGEON: Lorriane Shire, MD ASSISTANT:  Leda Quail, MD ; Clement Sayres, [place]  An experienced assistant was required given the standard of surgical care given the complexity of the case.  This assistant was needed for exposure, dissection, suctioning, retraction, instrument exchange, and for overall help during the procedure.  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  Risks of surgery were discussed with the patient including but not limited to: bleeding which may require transfusion; infection which may require antibiotics; injury to surrounding organs; need for additional procedures including laparotomy;  and other postoperative/anesthesia complications. Written informed consent was obtained.    FINDINGS:  Normal external genitalia, 10 wk size mobile uterus with Normal contours.  Laparoscopically: normal upper abdominal survey, boggy and enlarged uterus, bilateral fallopian tubes with fimbrea missing, normal bilateral ovaries, bilateral ureters seen, normal anterior cul de sac, normal posterior cul de sac Cystoscopically: normal bladder wall without apparent injury, bilateral ureteral orifices, urine from bilateral ureteral orifices   ANESTHESIA: General, paracervical block INTRAVENOUS FLUIDS:  1700 ml of LR ESTIMATED BLOOD LOSS:  20 ml URINE OUTPUT: 110 ml SPECIMENS: uterus, cervix, bilateral fallopian tubes COMPLICATIONS:  None immediate.  PROCEDURE: The risks, benefits, and alternatives of surgery were explained, understood, and accepted. Consents were signed. All questions were answered. She was taken to the operating room and general anesthesia was applied without complication. She was placed in the dorsal lithotomy position and her abdomen and  vagina were prepped and draped after she had been carefully positioned on the table. A bimanual exam revealed a boggy, 10 week size uterus that was mobile. Her adnexa were not enlarged. A Foley catheter was placed and it drained clear throughout the case. A speculum was placed and the cervix visualized. The cervix was measured and the uterus was sounded to 9 cm. A Rumi uterine manipulator using a 8cm cup and 3.5cm tip was placed without difficulty.  Gloves were changed and attention was turned to the abdomen. All incisions were infiltrated with local anesthetic. A 5mm incision was made in the umbilicus and a veress needle was introduced but the peritoneal cavity not reached. An optiview trocar was introduced into the abdomen. The Entry was confirmed with low opening intraabdominal pressure and visualization and the abdomen was then insufflated. After good pneumoperitoneum was established, the abdomen was surveyed including the upper abdomen.She was placed in Trendelenburg position and ports were placed in the RLQ, LLQ, and an 8mm airseal trocar in the RUQ.   The left  mesosalpinx was serially cauterized and cut and removed from the abdomen. The uteroovarian ligament was cauterized and divided. The round ligament was divided. The leaves of the broad ligament were separated and the anterior leaf dissected down to the level of the Koh ring. The posterior leaf was dissected and the uterine vessels were skeletonized. The uterine vessels were cauterized, divided, and lateralized to the cup edge. Attention was turned to the right  side where the same was completed.  The bladder was pushed from the operative site and the tissue dissected down to the pubocervical fascia. Once adequately mobilized, the colpotomy was completed using harmonic scalpel device and the uterus removed from the pelvis.    The vaginal cuff was then closed using 0 Vloc in a running fashion. The pelvis was irrigated. All pedicles were inspected. No  bleeding was noted.  CO2 pressures were lowered to 7mm Hg.  Again, no bleeding was noted.  Ureters were noted deep in the pelvis to be peristalsing.  Arista was placed along the pedicles.  At this point the procedure was completed.  The remaining instruments were removed.  The patient was taken out of Trendelenburg positioning.    Attention was then turned the vagina and the cuff was inspected. No bleeding was noted. The Foley catheter was removed.  Cystoscopy was performed.  No sutures or bladder injuries were noted.  Ureters were noted with efflux of urine from bilateral ureteral orifices.  Foley was left out after the cystoscopic fluid was drained and cystoscope removed.  The skin was then closed with subcuticular stitches of 4-0 Vicryl. The skin was cleansed Dermabond was applied. Sponge, lap, needle, instrument counts were correct x2. Patient tolerated the procedure very well. She was awakened from anesthesia, extubated and taken to recovery in stable condition.    Lorriane Shire, MD Minimally Invasive Gynecologic Surgery  Obstetrics and Gynecology, [facility] for [facility], [facility] 10/12/2023

## 2023-10-12 NOTE — Anesthesia Procedure Notes (Signed)
Procedure Name: Intubation Date/Time: 10/12/2023 11:46 AM  Performed by: Jessica Priest, CRNAPre-anesthesia Checklist: Patient identified, Emergency Drugs available, Suction available, Patient being monitored and Timeout performed Patient Re-evaluated:Patient Re-evaluated prior to induction Oxygen Delivery Method: Circle system utilized Preoxygenation: Pre-oxygenation with 100% oxygen Induction Type: IV induction Ventilation: Mask ventilation without difficulty Laryngoscope Size: Mac and 3 Grade View: Grade II Tube type: Oral Tube size: 7.0 mm Number of attempts: 1 Airway Equipment and Method: Stylet and Oral airway Placement Confirmation: ETT inserted through vocal cords under direct vision, positive ETCO2, breath sounds checked- equal and bilateral and CO2 detector Secured at: 23 cm Tube secured with: Tape Dental Injury: Teeth and Oropharynx as per pre-operative assessment

## 2023-10-12 NOTE — Brief Op Note (Signed)
10/12/2023  12:32 PM  PATIENT:  Lisa [name]  43 y.o. female  PRE-OPERATIVE DIAGNOSIS:  Abnormal uterine bleeding  POST-OPERATIVE DIAGNOSIS:  Abnormal uterine bleeding  PROCEDURE:  Procedure(s): TOTAL LAPAROSCOPIC HYSTERECTOMY WITH BILATERAL SALPINGECTOMY (Bilateral) CYSTOSCOPY (N/A)  SURGEON:  Surgeons and Role:    Lorriane Shire, MD - Primary    * Jerene Bears, MD - Assisting  PHYSICIAN ASSISTANT: Clement Sayres, PA  ASSISTANTS: none   ANESTHESIA:   general and paracervical block  EBL:  20 ml   BLOOD ADMINISTERED:none  DRAINS: none   LOCAL MEDICATIONS USED:  BUPIVICAINE   SPECIMEN:  Source of Specimen:  uterus, cervix, bilateral fallopian tubes  DISPOSITION OF SPECIMEN:  PATHOLOGY  COUNTS:  YES  TOURNIQUET:  * No tourniquets in log *  DICTATION: .Note written in EPIC  PLAN OF CARE:  extended recovery  PATIENT DISPOSITION:  PACU - hemodynamically stable.   Delay start of Pharmacological VTE agent (>24hrs) due to surgical blood loss or risk of bleeding: not applicable

## 2023-10-12 NOTE — Transfer of Care (Signed)
Immediate Anesthesia Transfer of Care Note  Patient: Lisa [name]  Procedure(s) Performed: Procedure(s) (LRB): TOTAL LAPAROSCOPIC HYSTERECTOMY WITH BILATERAL SALPINGECTOMY (Bilateral) CYSTOSCOPY (N/A)  Patient Location: PACU  Anesthesia Type: GA  Level of Consciousness: awake, sedated, patient cooperative and responds to stimulation, c/o pain in back - comfort measures given w/ medication   Airway & Oxygen Therapy: Patient Spontanous Breathing and Patient connected to [place] oxygen  Post-op Assessment: Report given to PACU RN, Post -op Vital signs reviewed and stable and Patient moving all extremities  Post vital signs: Reviewed and stable  Complications: No apparent anesthesia complications

## 2023-10-12 NOTE — Telephone Encounter (Signed)
Called patient and confirmed ID x2. Recommend resuming wegovy next week. FMLA paperwork given preop, will drop off with clinical staff tomorrow for completion. All questions answered and will follow up outpatient.

## 2023-10-12 NOTE — Discharge Instructions (Addendum)
Post Op Hysterectomy Instructions Please read the instructions below. Refer to these instructions for the next few weeks. These instructions provide you with general information on caring for yourself after surgery. Your caregiver may also give you specific instructions. While your treatment has been planned according to the most current medical practices available, unavoidable problems sometimes happen. If you have any problems or questions after you leave, please call your caregiver.  HOME CARE INSTRUCTIONS Healing will take time. You will have discomfort, tenderness, swelling and bruising at the operative site for a couple of weeks. This is normal and will get better as time goes on.  Only take over-the-counter or prescription medicines for pain, discomfort or fever as directed by your caregiver.  Do not take aspirin. It can cause bleeding.  Do not drive when taking pain medication.  Follow your caregiver's advice regarding diet, exercise, lifting, driving and general activities.  Resume your usual diet as directed and allowed.  Get plenty of rest and sleep.  Do not douche, use tampons, or have sexual intercourse until your caregiver gives you permission. .  Take your temperature if you feel hot or flushed.  You may shower today when you get home.  No tub bath for one week.   Do not drink alcohol until you are not taking any narcotic pain medications.  Try to have someone home with you for a week or two to help with the household activities.   Be careful over the next two to three weeks with any activities at home that involve lifting, pushing, or pulling.  Listen to your body--if something feels uncomfortable to do, then don't do it. Make sure you and your family understands everything about your operation and recovery.  Walking up stairs is fine. Do not sign any legal documents until you feel normal again.  Keep all your follow-up appointments as recommended by your caregiver.   PLEASE CALL  THE OFFICE IF: There is swelling, redness or increasing pain in the wound area.  Pus is coming from the wound.  You notice a bad smell from the wound or surgical dressing.  You have pain, redness and swelling from the intravenous site.  The wound is breaking open (the edges are not staying together).   You develop pain or bleeding when you urinate.  You develop abnormal vaginal discharge.  You have any type of abnormal reaction or develop an allergy to your medication.  You need stronger pain medication for your pain   SEEK IMMEDIATE MEDICAL CARE: You develop a temperature of 100.5 or higher.  You develop abdominal pain.  You develop chest pain.  You develop shortness of breath.  You pass out.  You develop pain, swelling or redness of your leg.  You develop heavy vaginal bleeding with or without blood clots.   MEDICATIONS: Restart your regular medications BUT wait one week before restarting all vitamins and mineral supplements Use Motrin 800mg  every 8 hours for the next several days.   Take Tylenol 1000mg  every 8 hours for the next several days Use oxycodone 5 mg every 4-6 hours. Taking motrin and tylenol should help reduce how often you use oxycodone.  You may use an over the counter stool softener like Colace or Dulcolax to help with starting a bowel movement.  You can also use miralax (polyethylene glycol). Start the day after you go home.  Warm liquids, fluids, and ambulation help too.  If you have not had a bowel movement in four days, you need to  call the office.    Post Anesthesia Home Care Instructions  Activity: Get plenty of rest for the remainder of the day. A responsible individual must stay with you for 24 hours following the procedure.  For the next 24 hours, DO NOT: -Drive a car -Advertising copywriter -Drink alcoholic beverages -Take any medication unless instructed by your physician -Make any legal decisions or sign important papers.  Meals: Start with liquid  foods such as gelatin or soup. Progress to regular foods as tolerated. Avoid greasy, spicy, heavy foods. If nausea and/or vomiting occur, drink only clear liquids until the nausea and/or vomiting subsides. Call your physician if vomiting continues.  Special Instructions/Symptoms: Your throat may feel dry or sore from the anesthesia or the breathing tube placed in your throat during surgery. If this causes discomfort, gargle with warm salt water. The discomfort should disappear within 24 hours.  If you had a scopolamine patch placed behind your ear for the management of post- operative nausea and/or vomiting:  1. The medication in the patch is effective for 72 hours, after which it should be removed.  Wrap patch in a tissue and discard in the trash. Wash hands thoroughly with soap and water. 2. You may remove the patch earlier than 72 hours if you experience unpleasant side effects which may include dry mouth, dizziness or visual disturbances. 3. Avoid touching the patch. Wash your hands with soap and water after contact with the patch.        May take Tylenol after 3:00 pm if needed May take Ibuprofen, advil, aleve, toradol, motrin or any nsaid after 6:30 pm if needed

## 2023-10-12 NOTE — Anesthesia Postprocedure Evaluation (Signed)
Anesthesia Post Note  Patient: Lisa [name]  Procedure(s) Performed: TOTAL LAPAROSCOPIC HYSTERECTOMY WITH BILATERAL SALPINGECTOMY (Bilateral: Abdomen) CYSTOSCOPY (Bladder)     Patient location during evaluation: PACU Anesthesia Type: General Level of consciousness: awake and alert Pain management: pain level controlled Vital Signs Assessment: post-procedure vital signs reviewed and stable Respiratory status: spontaneous breathing, nonlabored ventilation and respiratory function stable Cardiovascular status: blood pressure returned to baseline and stable Postop Assessment: no apparent nausea or vomiting Anesthetic complications: no  No notable events documented.  Last Vitals:  Vitals:   10/12/23 1345 10/12/23 1400  BP: 104/69 114/65  Pulse: 63 63  Resp: 10 10  Temp:  36.9 C  SpO2: 98% 100%    Last Pain:  Vitals:   10/12/23 1400  TempSrc:   PainSc: 2                  Charnee Turnipseed,W. EDMOND

## 2023-10-12 NOTE — H&P (Signed)
OB/GYN Pre-Op History and Physical  Lisa [name] is a 43 y.o. [id number] presenting for surgical management of AUB.   Increased bleeding and feeing more tired as well as VSM.  H/H 11.3/35.2      Past Medical History:  Diagnosis Date   Abnormal uterine bleeding (AUB)    Eczema    History of anemia    history with pregnancy   History of cervical dysplasia    Hx of mastitis    Wears glasses     Past Surgical History:  Procedure Laterality Date   BILATERAL SALPINGECTOMY Bilateral 11/17/2012   Procedure: BILATERAL SALPINGECTOMY;  Surgeon: Lavina Hamman, MD;  Location: [facility];  Service: Gynecology;  Laterality: Bilateral;   LAPAROSCOPY Bilateral 11/17/2012   Procedure: LAPAROSCOPY OPERATIVE;  Surgeon: Lavina Hamman, MD;  Location: [facility];  Service: Gynecology;  Laterality: Bilateral;    OB History  Gravida Para Term Preterm AB Living  5 5 5   5   SAB IAB Ectopic Multiple Live Births      5    # Outcome Date GA Lbr Len/2nd Weight Sex Type Anes PTL Lv  5 Term 09/27/12 [redacted]w[redacted]d 08:02 / 00:01 3090 g M Vag-Spont EPI  LIV  4 Term 2009 [redacted]w[redacted]d 05:00 2863 g M Vag-Spont EPI  LIV  3 Term 2006 [redacted]w[redacted]d 05:00 2778 g M Vag-Spont EPI  LIV  2 Term 2002 [redacted]w[redacted]d 05:00 2523 g F Vag-Spont None  LIV  1 Term 2001 [redacted]w[redacted]d 18:00 2523 g F Vag-Forceps EPI  LIV    Social History   Socioeconomic History   Marital status: Single    Spouse name: Not on file   Number of children: Not on file   Years of education: Not on file   Highest education level: Not on file  Occupational History   Not on file  Tobacco Use   Smoking status: Every Day    Types: Cigarettes   Smokeless tobacco: Never   Tobacco comments:    09-30-2023  per pt 1 pp3d,  started in 2013  Vaping Use   Vaping status: Never Used  Substance and Sexual Activity   Alcohol use: No   Drug use: Never   Sexual activity: Yes    Birth control/protection: Surgical  Other Topics Concern   Not on file  Social History Narrative   Not on file    Social Drivers of Health   Financial Resource Strain: Not on file  Food Insecurity: Not on file  Transportation Needs: Not on file  Physical Activity: Not on file  Stress: Not on file  Social Connections: Not on file    Family History  Problem Relation Age of Onset   Diabetes Mother    Cancer Mother    Hypertension Mother    Thyroid cancer Mother    Cancer Father        bone   Diabetes Father    Cancer Maternal Aunt        pancreas   COPD Maternal Aunt    COPD Maternal Uncle    Hypertension Maternal Grandmother    Cancer Paternal Grandmother        pancreas   Breast cancer Neg Hx     Medications Prior to Admission  Medication Sig Dispense Refill Last Dose/Taking   phentermine 30 MG capsule Take 30 mg by mouth daily.   09/30/2023   Semaglutide-Weight Management (WEGOVY) 0.25 MG/0.5ML SOAJ Inject 0.25 mg into the skin once a week. Tuesday's   09/28/2023   topiramate (  TOPAMAX) 25 MG tablet Take 25 mg by mouth daily.   Past Month   Vitamin D, Ergocalciferol, (DRISDOL) 1.25 MG (50000 UNIT) CAPS capsule Take 50,000 Units by mouth once a week. Monday's   Past Week   citalopram (CELEXA) 20 MG tablet Take 1 tablet (20 mg total) by mouth daily. (Patient not taking: Reported on 08/12/2023) 30 tablet 1    triamcinolone cream (KENALOG) 0.1 % Apply 1 Application topically 2 (two) times daily as needed.   Unknown    No Known Allergies  Review of Systems: Negative except for what is mentioned in HPI.     Physical Exam: BP 124/84   Pulse 83   Temp (!) 97.5 F (36.4 C) (Oral)   Resp 17   Ht 5\' 1"  (1.549 m)   Wt 95.5 kg   LMP 09/20/2023 (Exact Date)   SpO2 98%   BMI 39.77 kg/m  CONSTITUTIONAL: Well-developed, well-nourished and in no acute distress.  HENT:  Normocephalic, atraumatic, External right and left ear normal. Oropharynx is clear and moist EYES: Conjunctivae and EOM are normal. Pupils are equal, round, and reactive to light. No scleral icterus.  NECK: Normal range of  motion, supple, no masses SKIN: Skin is warm and dry. No rash noted. Not diaphoretic. No erythema. No pallor. NEUROLGIC: Alert and oriented to person, place, and time. Normal reflexes, muscle tone coordination. No cranial nerve deficit noted. PSYCHIATRIC: Normal mood and affect. Normal behavior. Normal judgment and thought content. RESPIRATORY: Normal effort PELVIC: Deferred   Pertinent Labs/Studies:   Results for orders placed or performed during the hospital encounter of 10/12/23 (from the past 72 hours)  Pregnancy, urine POC     Status: None   Collection Time: 10/12/23  8:45 AM  Result Value Ref Range   Preg Test, Ur NEGATIVE NEGATIVE    Comment:        THE SENSITIVITY OF THIS METHODOLOGY IS >24 mIU/mL        Assessment and Plan :Lisa [name] is a 43 y.o. [id number] here for definitive surgical management of UAB.   Patient desires surgical management with TLH, BS, cysto.  The risks of surgery were discussed in detail with the patient including but not limited to: bleeding which may require transfusion or reoperation; infection which may require prolonged hospitalization or re-hospitalization and antibiotic therapy; injury to bowel, bladder, ureters and major vessels or other surrounding organs which may lead to other procedures; formation of adhesions; need for additional procedures including laparotomy or subsequent procedures secondary to intraoperative injury or abnormal pathology; thromboembolic phenomenon; incisional problems and other postoperative or anesthesia complications.  Patient was told that the likelihood that her condition and symptoms will be treated effectively with this surgical management was high; the postoperative expectations were also discussed in detail. The patient also understands the alternative treatment options which were discussed in full. All questions were answered.  She was told that she will be contacted by our surgical scheduler regarding the time and  date of her surgery; routine preoperative instructions will be given to her by the preoperative nursing team.    Printed patient education handouts about the procedure were given to the patient to review at home.    Lorriane Shire, M.D. Minimally Invasive Gynecologic Surgery and Pelvic Pain Specialist Attending Obstetrician & Gynecologist, [facility] [employer], [facility]

## 2023-10-13 ENCOUNTER — Encounter (HOSPITAL_BASED_OUTPATIENT_CLINIC_OR_DEPARTMENT_OTHER): Payer: Self-pay | Admitting: Obstetrics and Gynecology

## 2023-10-13 ENCOUNTER — Encounter: Payer: Self-pay | Admitting: Obstetrics and Gynecology

## 2023-10-13 LAB — SURGICAL PATHOLOGY

## 2023-10-18 NOTE — Telephone Encounter (Signed)
Pt sent following message via appt request on 10/15/23: I am feeling an immense amount of pain. The pain meds aren't doing much and I'm not sleeping much because of the pain. I have been walking, drinking plenty of water, staying away from fried foods. Do you have any suggestions?   Reviewed with Briscoe Deutscher, MD who recommends RN verify pain has continued over the weekend. Should also determine if patient is experiencing constipation or gas pain. Pt may try heating pad and rotating ibuprofen and Tylenol for pain. Called pt to review; VM left. MyChart message sent.

## 2023-10-27 ENCOUNTER — Other Ambulatory Visit: Payer: Self-pay

## 2023-10-27 ENCOUNTER — Telehealth: Payer: Medicaid Other | Admitting: Obstetrics and Gynecology

## 2023-10-27 ENCOUNTER — Encounter: Payer: Self-pay | Admitting: Obstetrics and Gynecology

## 2023-10-27 DIAGNOSIS — N39498 Other specified urinary incontinence: Secondary | ICD-10-CM

## 2023-10-27 DIAGNOSIS — Z09 Encounter for follow-up examination after completed treatment for conditions other than malignant neoplasm: Secondary | ICD-10-CM

## 2023-10-27 DIAGNOSIS — Z1331 Encounter for screening for depression: Secondary | ICD-10-CM

## 2023-10-27 NOTE — Progress Notes (Signed)
 GYNECOLOGY VIRTUAL VISIT ENCOUNTER NOTE  Provider location: Center for [facility]   Patient location: Home  I connected with Lisa [name] on 10/27/23 at 11:15 AM EST by MyChart Video Encounter and verified that I am speaking with the correct person using two identifiers.   I discussed the limitations, risks, security and privacy concerns of performing an evaluation and management service virtually and the availability of in person appointments. I also discussed with the patient that there may be a patient responsible charge related to this service. The patient expressed understanding and agreed to proceed.   History:  Lisa [name] is a 43 y.o. [id number] female being evaluated today for postop visit. Had significant pain over initial weekend postop but has improved. Her incision are doing well. Has had some spotting. Feels occasional tightness in her abdomen but otherwise doing well. Wondering when she can resume activities such as driving, bend over, sleeping on her side, etc.    Past Medical History:  Diagnosis Date   Abnormal uterine bleeding (AUB)    Eczema    History of anemia    history with pregnancy   History of cervical dysplasia    Hx of mastitis    Wears glasses    Past Surgical History:  Procedure Laterality Date   BILATERAL SALPINGECTOMY Bilateral 11/17/2012   Procedure: BILATERAL SALPINGECTOMY;  Surgeon: Krystal Deaner, MD;  Location: [facility];  Service: Gynecology;  Laterality: Bilateral;   CYSTOSCOPY N/A 10/12/2023   Procedure: CYSTOSCOPY;  Surgeon: Jeralyn Crutch, MD;  Location: [facility];  Service: Gynecology;  Laterality: N/A;   LAPAROSCOPY Bilateral 11/17/2012   Procedure: LAPAROSCOPY OPERATIVE;  Surgeon: Krystal Deaner, MD;  Location: [facility];  Service: Gynecology;  Laterality: Bilateral;   TOTAL LAPAROSCOPIC HYSTERECTOMY WITH SALPINGECTOMY Bilateral 10/12/2023   Procedure: TOTAL LAPAROSCOPIC HYSTERECTOMY WITH  BILATERAL SALPINGECTOMY;  Surgeon: Jeralyn Crutch, MD;  Location: [facility];  Service: Gynecology;  Laterality: Bilateral;   The following portions of the patient's history were reviewed and updated as appropriate: allergies, current medications, past family history, past medical history, past social history, past surgical history and problem list.     Review of Systems:  Pertinent items noted in HPI and remainder of comprehensive ROS otherwise negative.  Physical Exam:   General:  Alert, oriented and cooperative. Patient appears to be in no acute distress.  Mental Status: Normal mood and affect. Normal behavior. Normal judgment and thought content.   Respiratory: Normal respiratory effort, no problems with respiration noted  Rest of physical exam deferred due to type of encounter  Labs and Imaging FINAL MICROSCOPIC DIAGNOSIS:   A. UTERUS, CERVIX, BILATERAL FALLOPIAN TUBES, HYSTERECTOMY, AND  SALPINGECTOMY:  - Uterus with leiomyomata, largest measuring 0.7 cm  - Benign secretory endometrium  - Benign unremarkable cervix  - Benign unremarkable bilateral fallopian tubes  - No evidence of malignancy    Assessment and Plan:     1. Postop check (Primary) Overall improving. Discussed ok to resume activities other than heavy lifting and pelvic rest. Reviewed benign pathology. Return for 8 week cuff check.   2. Other urinary incontinence Recommend urine study to rule out infectious cause of urinary incontinence - Urine Culture; Future        I discussed the assessment and treatment plan with the patient. The patient was provided an opportunity to ask questions and all were answered. The patient agreed with the plan and demonstrated an understanding of the instructions.  The patient was advised to call back or seek an in-person evaluation/go to the ED if the symptoms worsen or if the condition fails to improve as anticipated.  I provided 5 minutes of  face-to-face time during this encounter.   Carter Quarry, MD Center for [employer], [facility]

## 2023-11-02 ENCOUNTER — Encounter: Payer: Self-pay | Admitting: Obstetrics and Gynecology

## 2023-12-08 ENCOUNTER — Ambulatory Visit: Payer: Medicaid Other | Admitting: Obstetrics and Gynecology

## 2023-12-08 ENCOUNTER — Encounter: Payer: Self-pay | Admitting: Obstetrics and Gynecology

## 2023-12-08 ENCOUNTER — Other Ambulatory Visit: Payer: Self-pay

## 2023-12-08 VITALS — BP 110/80 | HR 81 | Wt 216.0 lb

## 2023-12-08 DIAGNOSIS — Z09 Encounter for follow-up examination after completed treatment for conditions other than malignant neoplasm: Secondary | ICD-10-CM

## 2023-12-08 DIAGNOSIS — N939 Abnormal uterine and vaginal bleeding, unspecified: Secondary | ICD-10-CM

## 2023-12-08 NOTE — Progress Notes (Signed)
   POSTOPERATIVE VISIT NOTE   Subjective:     Lisa [name] is a 43 y.o. [id number] who presents to the clinic 8 weeks status post  TLH, BS, cysto  for abnormal uterine bleeding. Eating a regular diet without difficulty. Bowel movements are normal. The patient is not having any pain. Feels occasional sensation of "shifting" within her abdomen but no pain and otherwise feels well.  Incision: doing well Vaginal bleeding: resolved Resumed sexual acitivity: no  Reports being given cephelexin due to infection around one of my sites; finished 7d of antibiotics last wedness. Had a rash and bumps all over the incsion and white stuff coming out of RLQ incision  The following portions of the patient's history were reviewed and updated as appropriate: allergies, current medications, past family history, past medical history, past social history, past surgical history, and problem list..   Review of Systems Pertinent items are noted in HPI.    Objective:    There were no vitals taken for this visit. General:  alert, cooperative, and no distress  Abdomen: soft, non-tender  Incision:   healing well, no drainage, no erythema, no hernia, no seroma, no swelling, no dehiscence, incision well approximated  Pelvic:    Vaginal cuff intact on visual inspection and digital palpation, nonteder    Pathology Results: FINAL MICROSCOPIC DIAGNOSIS:   A. UTERUS, CERVIX, BILATERAL FALLOPIAN TUBES, HYSTERECTOMY, AND  SALPINGECTOMY:  - Uterus with leiomyomata, largest measuring 0.7 cm  - Benign secretory endometrium  - Benign unremarkable cervix  - Benign unremarkable bilateral fallopian tubes  - No evidence of malignancy    Assessment:   Doing well postoperatively. Operative findings again reviewed. Pathology report discussed.   Plan:    1. Postop check (Primary) Doing well Meeting postop goals  2. Abnormal uterine bleeding (AUB) resolved   Activity restrictions:  pelvic rest until 12  weeks Anticipated return to work: now. Follow up: as needed  Lorriane Shire, MD Obstetrician & Gynecologist, [facility] for [employer], [facility]

## 2024-01-24 ENCOUNTER — Encounter (HOSPITAL_COMMUNITY): Payer: Self-pay | Admitting: Emergency Medicine

## 2024-01-24 ENCOUNTER — Emergency Department (HOSPITAL_COMMUNITY)
Admission: EM | Admit: 2024-01-24 | Discharge: 2024-01-25 | Disposition: A | Discharge status: Home / Self Care | Attending: Emergency Medicine | Admitting: Emergency Medicine

## 2024-01-24 DIAGNOSIS — K529 Noninfective gastroenteritis and colitis, unspecified: Secondary | ICD-10-CM | POA: Insufficient documentation

## 2024-01-24 DIAGNOSIS — R112 Nausea with vomiting, unspecified: Secondary | ICD-10-CM | POA: Diagnosis present

## 2024-01-24 LAB — COMPREHENSIVE METABOLIC PANEL WITH GFR
ALT: 23 U/L (ref 0–44)
AST: 20 U/L (ref 15–41)
Albumin: 3.5 g/dL (ref 3.5–5.0)
Alkaline Phosphatase: 152 U/L — ABNORMAL HIGH (ref 38–126)
Anion gap: 11 (ref 5–15)
BUN: <5 mg/dL — ABNORMAL LOW (ref 6–20)
CO2: 22 mmol/L (ref 22–32)
Calcium: 9.9 mg/dL (ref 8.9–10.3)
Chloride: 106 mmol/L (ref 98–111)
Creatinine, Ser: 0.77 mg/dL (ref 0.44–1.00)
GFR, Estimated: >60 mL/min (ref >60)
Glucose, Bld: 85 mg/dL (ref 70–99)
Potassium: 3.7 mmol/L (ref 3.5–5.1)
Sodium: 139 mmol/L (ref 135–145)
Total Bilirubin: 0.7 mg/dL (ref 0.0–1.2)
Total Protein: 7.4 g/dL (ref 6.5–8.1)

## 2024-01-24 LAB — CBC
HCT: 42.2 % (ref 36.0–46.0)
Hemoglobin: 13.4 g/dL (ref 12.0–15.0)
MCH: 28.8 pg (ref 26.0–34.0)
MCHC: 31.8 g/dL (ref 30.0–36.0)
MCV: 90.8 fL (ref 80.0–100.0)
Platelets: 495 10*3/uL — ABNORMAL HIGH (ref 150–400)
RBC: 4.65 MIL/uL (ref 3.87–5.11)
RDW: 14.8 % (ref 11.5–15.5)
WBC: 10.5 10*3/uL (ref 4.0–10.5)
nRBC: 0 % (ref 0.0–0.2)

## 2024-01-24 NOTE — ED Triage Notes (Addendum)
 Pt here from home with c/o n/v and passing some bright red blood from her bottom ,. No stool being passed , pt has no rectal pain or known hemorrhoids

## 2024-01-24 NOTE — ED Notes (Signed)
 Please give update to Husband Galvin Jules) [phone]

## 2024-01-25 ENCOUNTER — Emergency Department (HOSPITAL_COMMUNITY)

## 2024-01-25 MED ORDER — ONDANSETRON 4 MG PO TBDP: 4.0000 mg | ORAL_TABLET | Freq: Three times a day (TID) | ORAL | 0 refills | Status: DC | PRN

## 2024-01-25 MED ORDER — IOHEXOL 350 MG/ML SOLN
75.0000 mL | Freq: Once | INTRAVENOUS | Status: AC | PRN
  Administered 2024-01-25: 75 mL via INTRAVENOUS

## 2024-01-25 MED ORDER — AMOXICILLIN-POT CLAVULANATE 875-125 MG PO TABS: 1.0000 | ORAL_TABLET | Freq: Two times a day (BID) | ORAL | 0 refills | Status: DC

## 2024-01-25 MED ORDER — AMOXICILLIN-POT CLAVULANATE 875-125 MG PO TABS
1.0000 | ORAL_TABLET | Freq: Once | ORAL | Status: AC
  Administered 2024-01-25: 1 via ORAL
  Filled 2024-01-25: qty 1

## 2024-01-25 MED ORDER — DICYCLOMINE HCL 20 MG PO TABS: 20.0000 mg | ORAL_TABLET | Freq: Two times a day (BID) | ORAL | 0 refills | Status: AC

## 2024-01-25 NOTE — Discharge Instructions (Signed)
 Take the prescribed medication as directed. Recommend an diet for now-- see attached. Follow-up with GI-- call to schedule appt.  Can also follow-up with your primary care doctor in the interim. Return to the ED for new or worsening symptoms.

## 2024-01-25 NOTE — ED Provider Notes (Signed)
 [place] EMERGENCY DEPARTMENT AT [facility] Provider Note   CSN: [id number] Arrival date & time: 01/24/24  1649     History  Chief Complaint  Patient presents with   GI Problem    Lisa [name] is a 43 y.o. female.  The history is provided by the patient and medical records.  GI Problem   43 year old female with history of eczema, anemia, presenting to the ED with nausea, vomiting, and rectal bleeding.  States he began yesterday morning around 3 AM.  States she had a lot of intense abdominal cramping followed by nausea and vomiting.  She thought she was about to have diarrhea but only passed gelatinous appearing bloody liquid.  This is continued multiple times over the past several hours, most recent just prior to arrival in the emergency room.  She does report some continued lower abdominal discomfort.  She has not had any fever or chills.  She denies any prior GI history such as Crohn's disease, IBS, ulcerative colitis, etc.  She has never had prior colonoscopy.  She is not currently on anticoagulation.  Denies any rectal pain.  No history of hemorrhoids.  Home Medications Prior to Admission medications   Medication Sig Start Date End Date Taking? Authorizing Provider  amoxicillin-clavulanate (AUGMENTIN) 875-125 MG tablet Take 1 tablet by mouth every 12 (twelve) hours. 01/25/24  Yes Coretha Dew, PA-C  dicyclomine (BENTYL) 20 MG tablet Take 1 tablet (20 mg total) by mouth 2 (two) times daily. 01/25/24  Yes Coretha Dew, PA-C  ondansetron  (ZOFRAN -ODT) 4 MG disintegrating tablet Take 1 tablet (4 mg total) by mouth every 8 (eight) hours as needed. 01/25/24  Yes Coretha Dew, PA-C  acetaminophen  (TYLENOL ) 500 MG tablet Take 2 tablets (1,000 mg total) by mouth every 8 (eight) hours as needed. 10/12/23   Ajewole, Christana, MD  citalopram  (CELEXA ) 20 MG tablet Take 1 tablet (20 mg total) by mouth daily. 07/06/23   Ajewole, Christana, MD  ibuprofen  (ADVIL ) 800 MG tablet Take  1 tablet (800 mg total) by mouth 3 (three) times daily with meals as needed for headache, moderate pain (pain score 4-6) or cramping. Patient not taking: Reported on 12/08/2023 10/12/23   Ajewole, Christana, MD  oxyCODONE  (OXY IR/ROXICODONE ) 5 MG immediate release tablet Take 1 tablet (5 mg total) by mouth every 4 (four) hours as needed for severe pain (pain score 7-10) or breakthrough pain. Patient not taking: Reported on 12/08/2023 10/12/23   Ajewole, Christana, MD  phentermine 30 MG capsule Take 30 mg by mouth daily. Patient not taking: Reported on 10/27/2023 06/07/23   [provider]  Semaglutide-Weight Management (WEGOVY) 0.25 MG/0.5ML SOAJ Inject 0.25 mg into the skin once a week. Tuesday's    [provider]  topiramate (TOPAMAX) 25 MG tablet Take 25 mg by mouth daily. Patient not taking: Reported on 12/08/2023 06/06/23   [provider]  triamcinolone cream (KENALOG) 0.1 % Apply 1 Application topically 2 (two) times daily as needed.    [provider]  Vitamin D, Ergocalciferol, (DRISDOL) 1.25 MG (50000 UNIT) CAPS capsule Take 50,000 Units by mouth once a week. Monday's 04/23/23   [provider]      Allergies    Patient has no known allergies.    Review of Systems   Review of Systems  Gastrointestinal:  Positive for anal bleeding.  All other systems reviewed and are negative.   Physical Exam Updated Vital Signs BP 117/75   Pulse 66   Temp  98.8 F (37.1 C) (Oral)   Resp 18   LMP 09/20/2023 (Exact Date)   SpO2 100%  Physical Exam Vitals and nursing note reviewed.  Constitutional:      Appearance: She is well-developed.  HENT:     Head: Normocephalic and atraumatic.  Eyes:     Conjunctiva/sclera: Conjunctivae normal.     Pupils: Pupils are equal, round, and reactive to light.  Cardiovascular:     Rate and Rhythm: Normal rate and regular rhythm.     Heart sounds: Normal heart sounds.  Pulmonary:     Effort: Pulmonary effort is  normal.     Breath sounds: Normal breath sounds.  Abdominal:     General: Bowel sounds are normal.     Palpations: Abdomen is soft.     Tenderness: There is abdominal tenderness in the right lower quadrant, suprapubic area and left lower quadrant.     Comments: Mildly tender across lower abdomen, not rigid or peritoneal, normal bowel sounds  Genitourinary:    Comments: Rectum without hemorrhoids or other abnormalities Musculoskeletal:        General: Normal range of motion.     Cervical back: Normal range of motion.  Skin:    General: Skin is warm and dry.  Neurological:     Mental Status: She is alert and oriented to person, place, and time.     ED Results / Procedures / Treatments   Labs (all labs ordered are listed, but only abnormal results are displayed) Labs Reviewed  COMPREHENSIVE METABOLIC PANEL WITH GFR - Abnormal; Notable for the following components:      Result Value   BUN <5 (*)    Alkaline Phosphatase 152 (*)    All other components within normal limits  CBC - Abnormal; Notable for the following components:   Platelets 495 (*)    All other components within normal limits  POC OCCULT BLOOD, ED    EKG None  Radiology CT ABDOMEN PELVIS W CONTRAST Result Date: 01/25/2024 CLINICAL DATA:  Left lower quadrant pain and bloody stools EXAM: CT ABDOMEN AND PELVIS WITH CONTRAST TECHNIQUE: Multidetector CT imaging of the abdomen and pelvis was performed using the standard protocol following bolus administration of intravenous contrast. RADIATION DOSE REDUCTION: This exam was performed according to the departmental dose-optimization program which includes automated exposure control, adjustment of the mA and/or kV according to patient size and/or use of iterative reconstruction technique. CONTRAST:  75mL OMNIPAQUE IOHEXOL 350 MG/ML SOLN COMPARISON:  None Available. FINDINGS: Lower chest: No acute abnormality. Hepatobiliary: No focal liver abnormality is seen. No gallstones,  gallbladder wall thickening, or biliary dilatation. Pancreas: Unremarkable. No pancreatic ductal dilatation or surrounding inflammatory changes. Spleen: Normal in size without focal abnormality. Adrenals/Urinary Tract: Adrenal glands are within normal limits. Kidneys demonstrate a normal enhancement pattern bilaterally. No renal calculi or obstructive changes are seen. The bladder is decompressed. Stomach/Bowel: The appendix is within normal limits. No obstructive changes of the colon are seen. The descending colon and sigmoid demonstrates very mild edematous changes which may represent early changes of colitis. No active extravasation is noted. Small bowel and stomach are within normal limits. Vascular/Lymphatic: No significant vascular findings are present. No enlarged abdominal or pelvic lymph nodes. Reproductive: Status post hysterectomy. No adnexal masses. Other: No abdominal wall hernia or abnormality. No abdominopelvic ascites. Musculoskeletal: No acute or significant osseous findings. IMPRESSION: Findings suggestive of mild colitis in the descending and sigmoid colons. Electronically Signed   By: Regenia Cape.D.  On: 01/25/2024 02:47    Procedures Procedures    Medications Ordered in ED Medications  iohexol (OMNIPAQUE) 350 MG/ML injection 75 mL (75 mLs Intravenous Contrast Given 01/25/24 0233)  amoxicillin-clavulanate (AUGMENTIN) 875-125 MG per tablet 1 tablet (1 tablet Oral Given 01/25/24 0356)    ED Course/ Medical Decision Making/ A&P                                 Medical Decision Making Amount and/or Complexity of Data Reviewed Labs: ordered. Radiology: ordered and independent interpretation performed. ECG/medicine tests: ordered and independent interpretation performed.  Risk Prescription drug management.   43 year old female presenting to the ED for nausea, vomiting, and rectal bleeding.  She is not passing blood in stool but rather seems to be gelatinous bloody material.   She is not having any rectal pain.  She is afebrile and nontoxic in appearance here.  She does have some mild tenderness to the lower abdomen but no peritoneal signs.  No rigidity or guarding.  Labs were obtained from triage and are grossly reassuring, hemoglobin is stable.  No significant electrolyte derangement.    Given constellation of her symptoms, concern for possible colitis, diverticulitis, or similar.  CT was obtained which confirms colitis.  There is no perforation or abscess.  I suspect this is likely etiology of her bleeding.  She has not had any further episodes since arrival in the ED.  She remains hemodynamically stable.  I feel she is appropriate for outpatient management with oral antibiotics, bland diet, and close GI follow-up.  Can also follow-up with PCP in the interim.  Return here for any new or acute changes.  Final Clinical Impression(s) / ED Diagnoses Final diagnoses:  Colitis    Rx / DC Orders ED Discharge Orders          Ordered    amoxicillin-clavulanate (AUGMENTIN) 875-125 MG tablet  Every 12 hours        01/25/24 0314    dicyclomine (BENTYL) 20 MG tablet  2 times daily        01/25/24 0314    ondansetron  (ZOFRAN -ODT) 4 MG disintegrating tablet  Every 8 hours PRN        01/25/24 0314              Coretha Dew, PA-C 01/25/24 1610    Kelsey Patricia, MD 01/25/24 [id number]

## 2024-02-05 ENCOUNTER — Encounter (HOSPITAL_COMMUNITY): Payer: Self-pay | Admitting: Emergency Medicine

## 2024-02-05 ENCOUNTER — Other Ambulatory Visit: Payer: Self-pay

## 2024-02-05 ENCOUNTER — Ambulatory Visit (HOSPITAL_COMMUNITY): Admission: EM | Admit: 2024-02-05 | Discharge: 2024-02-05 | Disposition: A | Discharge status: Home / Self Care

## 2024-02-05 ENCOUNTER — Ambulatory Visit (INDEPENDENT_AMBULATORY_CARE_PROVIDER_SITE_OTHER)

## 2024-02-05 DIAGNOSIS — S62629A Displaced fracture of medial phalanx of unspecified finger, initial encounter for closed fracture: Secondary | ICD-10-CM | POA: Diagnosis not present

## 2024-02-05 DIAGNOSIS — S62626A Displaced fracture of medial phalanx of right little finger, initial encounter for closed fracture: Secondary | ICD-10-CM | POA: Diagnosis not present

## 2024-02-05 MED ORDER — IBUPROFEN 600 MG PO TABS: 600.0000 mg | ORAL_TABLET | Freq: Three times a day (TID) | ORAL | 0 refills | Status: AC | PRN

## 2024-02-05 NOTE — ED Triage Notes (Signed)
 Right little finger injury for one week.  Finger is tingling, swollen, bruised, numb sensation.  Was catching a ball when the ball forced right little finger downward, and twisted.    Has not had nay medications

## 2024-02-05 NOTE — Discharge Instructions (Signed)
 The middle bone of your finger is broken in a couple of places.  It is very important you follow-up with hand specialist as soon as possible.  Please call the hand specialist on-call first thing Monday morning (I have provided their information below).  Take ibuprofen  for pain.  Try to keep the finger in the splint until you are seen by the specialist.  If anything changes you have increasing pain, swelling, discoloration, numbness/tingling you should be seen immediately.

## 2024-02-05 NOTE — ED Provider Notes (Signed)
 [facility]    CSN: [id number] Arrival date & time: 02/05/24  1248      History   Chief Complaint Chief Complaint  Patient presents with   Finger Injury    HPI Lisa [name] is a 43 y.o. female.   Patient presents today with a weeklong history of right fifth finger pain.  Reports she was playing football with her sons when the football hit her finger causing it to bend backwards.  She immediately had pain has had ongoing pain since that time.  Currently it is rated 6 on a certain pain scale, described as throbbing, worse with manipulation, no alleviating factors identified.  She is right-handed.  She has had some intermittent numbness but denies any current numbness or paresthesias.  She has not been taking any over-the-counter medications for symptom management.  She is confident she is not pregnant as she is status post hysterectomy.    Past Medical History:  Diagnosis Date   Abnormal uterine bleeding (AUB)    Eczema    History of anemia    history with pregnancy   History of cervical dysplasia    Hx of mastitis    Wears glasses     Patient Active Problem List   Diagnosis Date Noted   Dysfunctional uterine bleeding 10/12/2023   LGSIL on Pap smear of cervix 12/22/2022   Contraception management 11/17/2012    Past Surgical History:  Procedure Laterality Date   BILATERAL SALPINGECTOMY Bilateral 11/17/2012   Procedure: BILATERAL SALPINGECTOMY;  Surgeon: Cyd Dowse, MD;  Location: [facility];  Service: Gynecology;  Laterality: Bilateral;   CYSTOSCOPY N/A 10/12/2023   Procedure: CYSTOSCOPY;  Surgeon: Kiki Pelton, MD;  Location: [facility] [place];  Service: Gynecology;  Laterality: N/A;   LAPAROSCOPY Bilateral 11/17/2012   Procedure: LAPAROSCOPY OPERATIVE;  Surgeon: Cyd Dowse, MD;  Location: [facility];  Service: Gynecology;  Laterality: Bilateral;   TOTAL LAPAROSCOPIC HYSTERECTOMY WITH SALPINGECTOMY Bilateral 10/12/2023   Procedure: TOTAL  LAPAROSCOPIC HYSTERECTOMY WITH BILATERAL SALPINGECTOMY;  Surgeon: Kiki Pelton, MD;  Location: [facility];  Service: Gynecology;  Laterality: Bilateral;    OB History     Gravida  5   Para  5   Term  5   Preterm      AB      Living  5      SAB      IAB      Ectopic      Multiple      Live Births  5            Home Medications    Prior to Admission medications   Medication Sig Start Date End Date Taking? Authorizing Provider  fluconazole (DIFLUCAN) 150 MG tablet Take 150 mg by mouth daily. 01/31/24  Yes [provider]  ibuprofen  (ADVIL ) 600 MG tablet Take 1 tablet (600 mg total) by mouth every 8 (eight) hours as needed. 02/05/24  Yes Lanee Chain K, PA-C  acetaminophen  (TYLENOL ) 500 MG tablet Take 2 tablets (1,000 mg total) by mouth every 8 (eight) hours as needed. 10/12/23   Ajewole, Christana, MD  dicyclomine  (BENTYL ) 20 MG tablet Take 1 tablet (20 mg total) by mouth 2 (two) times daily. 01/25/24   Coretha Dew, PA-C  Semaglutide-Weight Management (WEGOVY) 0.25 MG/0.5ML SOAJ Inject 0.25 mg into the skin once a week. Tuesday's    [provider]  Vitamin D, Ergocalciferol, (DRISDOL) 1.25 MG (50000 UNIT) CAPS capsule Take 50,000 Units by mouth once  a week. Monday's 04/23/23   [provider]    Family History Family History  Problem Relation Age of Onset   Diabetes Mother    Cancer Mother    Hypertension Mother    Thyroid cancer Mother    Cancer Father        bone   Diabetes Father    Cancer Maternal Aunt        pancreas   COPD Maternal Aunt    COPD Maternal Uncle    Hypertension Maternal Grandmother    Cancer Paternal Grandmother        pancreas   Breast cancer Neg Hx     Social History Social History   Tobacco Use   Smoking status: Every Day    Types: Cigarettes   Smokeless tobacco: Never   Tobacco comments:    09-30-2023  per pt 1 pp3d,  started in 2013  Vaping Use   Vaping status: Never Used   Substance Use Topics   Alcohol use: No   Drug use: Never     Allergies   Patient has no known allergies.   Review of Systems Review of Systems  Constitutional:  Positive for activity change. Negative for appetite change, fatigue and fever.  Musculoskeletal:  Positive for arthralgias and joint swelling. Negative for myalgias.  Skin:  Negative for color change and wound.  Neurological:  Negative for weakness and numbness (Intermittent but no current symptoms).     Physical Exam Triage Vital Signs ED Triage Vitals  Encounter Vitals Group     BP 02/05/24 1357 94/69     Systolic BP Percentile --      Diastolic BP Percentile --      Pulse Rate 02/05/24 1357 69     Resp 02/05/24 1357 18     Temp 02/05/24 1357 99 F (37.2 C)     Temp Source 02/05/24 1357 Oral     SpO2 02/05/24 1357 96 %     Weight --      Height --      Head Circumference --      Peak Flow --      Pain Score 02/05/24 1354 6     Pain Loc --      Pain Education --      Exclude from Growth Chart --    No data found.  Updated Vital Signs BP 94/69 (BP Location: Left Arm) Comment (BP Location): large cuff  Pulse 69   Temp 99 F (37.2 C) (Oral)   Resp 18   LMP 09/20/2023 (Exact Date)   SpO2 96%   Visual Acuity Right Eye Distance:   Left Eye Distance:   Bilateral Distance:    Right Eye Near:   Left Eye Near:    Bilateral Near:     Physical Exam Vitals reviewed.  Constitutional:      General: She is awake. She is not in acute distress.    Appearance: Normal appearance. She is well-developed. She is not ill-appearing.     Comments: Very pleasant female presented age in no acute distress sitting comfortably in exam room  HENT:     Head: Normocephalic and atraumatic.  Cardiovascular:     Rate and Rhythm: Normal rate and regular rhythm.     Heart sounds: Normal heart sounds, S1 normal and S2 normal. No murmur heard.    Comments: Capillary fill within 2 seconds right fingers Pulmonary:      Effort: Pulmonary effort is normal.  Breath sounds: Normal breath sounds. No wheezing, rhonchi or rales.     Comments: Clear to auscultation bilaterally Musculoskeletal:     Right hand: Swelling and tenderness present. No bony tenderness. Decreased range of motion. There is no disruption of two-point discrimination. Normal capillary refill.     Comments: Right fifth finger: Tenderness palpation over entire finger.  Finger is neurovascularly intact based on 2-point discrimination.  Decreased flexion and extension.  Psychiatric:        Behavior: Behavior is cooperative.      UC Treatments / Results  Labs (all labs ordered are listed, but only abnormal results are displayed) Labs Reviewed - No data to display  EKG   Radiology DG Finger Little Right Result Date: 02/05/2024 CLINICAL DATA:  jammed playing ball one week ago-still swollen, painful, discolored EXAM: RIGHT LITTLE FINGER 2+V COMPARISON:  None Available. FINDINGS: There is an oblique comminuted nondisplaced fracture of the middle fifth phalanx. There is favored intra-articular extension into the fifth PIP. Associated soft tissue edema. IMPRESSION: Comminuted nondisplaced fracture of the middle fifth phalanx with favored intra-articular extension into the fifth PIP. Electronically Signed   By: Clancy Crimes M.D.   On: 02/05/2024 14:21    Procedures Procedures (including critical care time)  Medications Ordered in UC Medications - No data to display  Initial Impression / Assessment and Plan / UC Course  I have reviewed the triage vital signs and the nursing notes.  Pertinent labs & imaging results that were available during my care of the patient were reviewed by me and considered in my medical decision making (see chart for details).     Patient is well-appearing, afebrile, nontoxic, nontachycardic.  Fingers neurovascular intact.  X-ray was obtained given mechanism of injury that showed comminuted nondisplaced  fracture of middle fifth phalanx.  She was placed in a static splint and this was buddy taped to provide protection.  Will start ibuprofen  for pain relief and we discussed that she is not to take additional NSAIDs with this medication and risk of GI bleeding.  She is to follow-up closely with hand and was given the contact information for provider on-call with instruction to call to schedule appointment first thing next week.  We discussed that if she has any worsening symptoms or signs of compartment syndrome including worsening pain, discoloration, cold sensation, persistent numbness or paresthesias she should be seen emergently.  Strict return precautions were given.  She declined work excuse note.  Final Clinical Impressions(s) / UC Diagnoses   Final diagnoses:  Closed comminuted fracture of middle phalanx of finger     Discharge Instructions      The middle bone of your finger is broken in a couple of places.  It is very important you follow-up with hand specialist as soon as possible.  Please call the hand specialist on-call first thing Monday morning (I have provided their information below).  Take ibuprofen  for pain.  Try to keep the finger in the splint until you are seen by the specialist.  If anything changes you have increasing pain, swelling, discoloration, numbness/tingling you should be seen immediately.   ED Prescriptions     Medication Sig Dispense Auth. Provider   ibuprofen  (ADVIL ) 600 MG tablet Take 1 tablet (600 mg total) by mouth every 8 (eight) hours as needed. 30 tablet Trevino Wyatt K, PA-C      PDMP not reviewed this encounter.   Budd Cargo, PA-C 02/05/24 1508

## 2024-08-14 ENCOUNTER — Other Ambulatory Visit: Payer: Self-pay | Admitting: Nurse Practitioner

## 2024-08-14 DIAGNOSIS — Z1231 Encounter for screening mammogram for malignant neoplasm of breast: Secondary | ICD-10-CM

## 2024-09-15 ENCOUNTER — Ambulatory Visit

## 2024-10-11 ENCOUNTER — Ambulatory Visit
